# Patient Record
Sex: Male | Born: 1975 | Race: White | Hispanic: No | Marital: Married | Smoking: Former smoker
Health system: Southern US, Community
[De-identification: ages and names within clinical notes are randomized; demographics above are authoritative.]

## PROBLEM LIST (undated history)

## (undated) ENCOUNTER — Emergency Department (HOSPITAL_COMMUNITY): Admission: EM | Payer: BC Managed Care – PPO | Source: Home / Self Care

## (undated) DIAGNOSIS — G4730 Sleep apnea, unspecified: Secondary | ICD-10-CM

## (undated) DIAGNOSIS — L0591 Pilonidal cyst without abscess: Secondary | ICD-10-CM

## (undated) DIAGNOSIS — E079 Disorder of thyroid, unspecified: Secondary | ICD-10-CM

## (undated) DIAGNOSIS — N189 Chronic kidney disease, unspecified: Secondary | ICD-10-CM

## (undated) DIAGNOSIS — K219 Gastro-esophageal reflux disease without esophagitis: Secondary | ICD-10-CM

## (undated) DIAGNOSIS — F329 Major depressive disorder, single episode, unspecified: Secondary | ICD-10-CM

## (undated) DIAGNOSIS — F32A Depression, unspecified: Secondary | ICD-10-CM

## (undated) DIAGNOSIS — E119 Type 2 diabetes mellitus without complications: Secondary | ICD-10-CM

## (undated) DIAGNOSIS — M109 Gout, unspecified: Secondary | ICD-10-CM

## (undated) DIAGNOSIS — R7989 Other specified abnormal findings of blood chemistry: Secondary | ICD-10-CM

## (undated) DIAGNOSIS — F419 Anxiety disorder, unspecified: Secondary | ICD-10-CM

## (undated) HISTORY — DX: Other specified abnormal findings of blood chemistry: R79.89

## (undated) HISTORY — DX: Anxiety disorder, unspecified: F41.9

## (undated) HISTORY — DX: Depression, unspecified: F32.A

## (undated) HISTORY — PX: WISDOM TOOTH EXTRACTION: SHX21

## (undated) HISTORY — DX: Chronic kidney disease, unspecified: N18.9

## (undated) HISTORY — PX: CYST EXCISION: SHX5701

## (undated) HISTORY — DX: Pilonidal cyst without abscess: L05.91

## (undated) HISTORY — PX: PILONIDAL CYST EXCISION: SHX744

## (undated) HISTORY — DX: Type 2 diabetes mellitus without complications: E11.9

## (undated) HISTORY — DX: Major depressive disorder, single episode, unspecified: F32.9

---

## 2005-01-10 ENCOUNTER — Encounter: Admission: RE | Admit: 2005-01-10 | Discharge: 2005-01-10 | Payer: Self-pay | Admitting: Occupational Medicine

## 2005-05-17 ENCOUNTER — Emergency Department (HOSPITAL_COMMUNITY): Admission: EM | Admit: 2005-05-17 | Discharge: 2005-05-17 | Payer: Self-pay | Admitting: Family Medicine

## 2015-07-21 DIAGNOSIS — K760 Fatty (change of) liver, not elsewhere classified: Secondary | ICD-10-CM | POA: Insufficient documentation

## 2015-07-21 DIAGNOSIS — E291 Testicular hypofunction: Secondary | ICD-10-CM | POA: Insufficient documentation

## 2015-07-21 DIAGNOSIS — E039 Hypothyroidism, unspecified: Secondary | ICD-10-CM | POA: Insufficient documentation

## 2015-07-21 DIAGNOSIS — G4730 Sleep apnea, unspecified: Secondary | ICD-10-CM | POA: Insufficient documentation

## 2015-07-21 DIAGNOSIS — E669 Obesity, unspecified: Secondary | ICD-10-CM | POA: Insufficient documentation

## 2015-07-21 DIAGNOSIS — F341 Dysthymic disorder: Secondary | ICD-10-CM | POA: Insufficient documentation

## 2015-08-21 DIAGNOSIS — N201 Calculus of ureter: Secondary | ICD-10-CM

## 2015-08-21 HISTORY — DX: Calculus of ureter: N20.1

## 2015-11-09 ENCOUNTER — Emergency Department (HOSPITAL_BASED_OUTPATIENT_CLINIC_OR_DEPARTMENT_OTHER)
Admission: EM | Admit: 2015-11-09 | Discharge: 2015-11-09 | Disposition: A | Discharge status: Home / Self Care | Payer: Worker's Compensation | Attending: Emergency Medicine | Admitting: Emergency Medicine

## 2015-11-09 ENCOUNTER — Encounter (HOSPITAL_BASED_OUTPATIENT_CLINIC_OR_DEPARTMENT_OTHER): Payer: Self-pay | Admitting: *Deleted

## 2015-11-09 ENCOUNTER — Emergency Department (HOSPITAL_BASED_OUTPATIENT_CLINIC_OR_DEPARTMENT_OTHER): Payer: Worker's Compensation

## 2015-11-09 DIAGNOSIS — Z79899 Other long term (current) drug therapy: Secondary | ICD-10-CM | POA: Insufficient documentation

## 2015-11-09 DIAGNOSIS — M545 Low back pain: Secondary | ICD-10-CM | POA: Diagnosis present

## 2015-11-09 DIAGNOSIS — M5416 Radiculopathy, lumbar region: Secondary | ICD-10-CM | POA: Diagnosis not present

## 2015-11-09 HISTORY — DX: Disorder of thyroid, unspecified: E07.9

## 2015-11-09 MED ORDER — PREDNISONE 10 MG (21) PO TBPK: 10.0000 mg | ORAL_TABLET | Freq: Every day | ORAL | 0 refills | Status: DC

## 2015-11-09 MED ORDER — KETOROLAC TROMETHAMINE 30 MG/ML IJ SOLN
30.0000 mg | Freq: Once | INTRAMUSCULAR | Status: AC
  Administered 2015-11-09: 30 mg via INTRAMUSCULAR
  Filled 2015-11-09: qty 1

## 2015-11-09 MED ORDER — DEXAMETHASONE SODIUM PHOSPHATE 10 MG/ML IJ SOLN
10.0000 mg | Freq: Once | INTRAMUSCULAR | Status: AC
  Administered 2015-11-09: 10 mg via INTRAMUSCULAR
  Filled 2015-11-09: qty 1

## 2015-11-09 MED ORDER — KETOROLAC TROMETHAMINE 10 MG PO TABS: 10.0000 mg | ORAL_TABLET | Freq: Four times a day (QID) | ORAL | 0 refills | Status: DC | PRN

## 2015-11-09 NOTE — ED Notes (Signed)
C/o low back pain, (denies: radiation, impact, numbness/ tingling, spasms, urinary sx, loss of control of bowel or bladder, nvd, fever or other sx).  h/o distant back injury w/o surgery (relief with PT/rehab and disc hydration).

## 2015-11-09 NOTE — ED Notes (Signed)
EDP at [facility] 

## 2015-11-09 NOTE — ED Notes (Signed)
Pt to xray

## 2015-11-09 NOTE — ED Provider Notes (Signed)
[facility] Note   CSN: [id number] Arrival date & time: 11/09/15  2127  By signing my name below, I, Suzan SlickAshley N. Elon SpannerLeger, attest that this documentation has been prepared under the direction and in the presence of Jacalyn LefevreJulie Coy Rochford, MD.  Electronically Signed: Suzan SlickAshley N. Elon SpannerLeger, ED Scribe. 11/09/15. 9:45 PM.    History   Chief Complaint Chief Complaint  Patient presents with  . Back Pain   The history is provided by the patient. No language interpreter was used.    HPI Comments: Marc Fisher is a 40 y.o. male with a PMHx of a bulging disc to L5 and S1 in 2008 who presents to the Emergency Department complaining of constant, unchanged lower back pain that radiates down the LLE to the knee cap x 2 days. 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 Pt states he heard a "pop" in his back at time of incident. Discomfort to back is exacerbated with movement. No alleviating factors at this time. No OTC medications or home remedies attempted prior to arrival. No recent fever, chills, nausea, vomiting, numbness, or loss of sensation.  PCP: No primary care provider on file.    Past Medical History:  Diagnosis Date  . Thyroid disease     There are no active problems to display for this patient.   Past Surgical History:  Procedure Laterality Date  . PILONIDAL CYST EXCISION    . WISDOM TOOTH EXTRACTION         Home Medications    Prior to Admission medications   Medication Sig Start Date End Date Taking? Authorizing Provider  Levothyroxine Sodium (SYNTHROID PO) Take by mouth.   Yes Historical Provider, MD  ketorolac (TORADOL) 10 MG tablet Take 1 tablet (10 mg total) by mouth every 6 (six) hours as needed. 11/09/15   Jacalyn LefevreJulie Jacques Willingham, MD  predniSONE (STERAPRED UNI-PAK 21 TAB) 10 MG (21) TBPK tablet Take 1 tablet (10 mg total) by mouth daily. Take 6 tabs by mouth daily  for 2 days, then 5 tabs for 2 days, then 4  tabs for 2 days, then 3 tabs for 2 days, 2 tabs for 2 days, then 1 tab by mouth daily for 2 days 11/09/15   Jacalyn LefevreJulie Jefte Carithers, MD    Family History No family history on file.  Social History Social History  Substance Use Topics  . Smoking status: Never Smoker  . Smokeless tobacco: Never Used  . Alcohol use Yes     Allergies   Penicillins and Sulfa antibiotics   Review of Systems Review of Systems  Constitutional: Negative for chills and fever.  Musculoskeletal: Positive for back pain.  Neurological: Negative for weakness and numbness.  All other systems reviewed and are negative.    Physical Exam Updated Vital Signs BP 131/78 (BP Location: Left Arm)   Pulse 60   Temp 97.8 F (36.6 C) (Oral)   Resp 20   Ht 5' 8.75" (1.746 m)   Wt 250 lb (113.4 kg)   SpO2 100%   BMI 37.19 kg/m   Physical Exam  Constitutional: He is oriented to person, place, and time. He appears well-developed and well-nourished.  HENT:  Head: Normocephalic and atraumatic.  Eyes: EOM are normal.  Neck: Normal range of motion.  Cardiovascular: Normal rate, regular rhythm, normal heart sounds and intact distal pulses.   Pulmonary/Chest: Effort normal and breath sounds normal. No respiratory distress.  Abdominal: Soft. He  exhibits no distension. There is no tenderness.  Musculoskeletal: Normal range of motion. He exhibits tenderness.  Lower lumbar tenderness noted. Pain with movement of the L leg.  Neurological: He is alert and oriented to person, place, and time.  Skin: Skin is warm and dry.  Psychiatric: He has a normal mood and affect. Judgment normal.  Nursing note and vitals reviewed.    ED Treatments / Results   DIAGNOSTIC STUDIES: Oxygen Saturation is 100% on RA, Normal by my interpretation.    COORDINATION OF CARE: 9:44 PM- Will give Decadron and Toradol. Will order imaging. Discussed treatment plan with pt at bedside and pt agreed to plan.      Labs (all labs ordered are listed,  but only abnormal results are displayed) Labs Reviewed - No data to display  EKG  EKG Interpretation None       Radiology Dg Lumbar Spine Complete  Result Date: 11/09/2015 CLINICAL DATA:  Stepped off ladder and twisted lower back, low back pain, history of L5-S1 bulging disc EXAM: LUMBAR SPINE - COMPLETE 4+ VIEW COMPARISON:  CT abdomen/pelvis dated 08/20/2015 FINDINGS: Five lumbar type vertebral bodies. Normal lumbar lordosis. No evidence of fracture or dislocation. Vertebral body heights are maintained. Mild degenerative changes at L5-S1. Visualized bony pelvis appears intact. IMPRESSION: No fracture or dislocation is seen. Mild degenerative changes at L5-S1. Electronically Signed   By: Charline BillsSriyesh  Krishnan M.D.   On: 11/09/2015 22:18    Procedures Procedures (including critical care time)  Medications Ordered in ED Medications  dexamethasone (DECADRON) injection 10 mg (10 mg Intramuscular Given 11/09/15 2154)  ketorolac (TORADOL) 30 MG/ML injection 30 mg (30 mg Intramuscular Given 11/09/15 2154)     Initial Impression / Assessment and Plan / ED Course  I have reviewed the triage vital signs and the nursing notes.  Pertinent labs & imaging results that were available during my care of the patient were reviewed by me and considered in my medical decision making (see chart for details).  Clinical Course   Pt is feeling better.  He does not want narcotics here or at home.  He has an open house tomorrow at school and does not want to not be his best.  He is given info on back exercises and is given the number to Dr. Pearletha ForgeHudnall.  He knows to return if worse.  Final Clinical Impressions(s) / ED Diagnoses   Final diagnoses:  Lumbar radiculopathy    New Prescriptions New Prescriptions   KETOROLAC (TORADOL) 10 MG TABLET    Take 1 tablet (10 mg total) by mouth every 6 (six) hours as needed.   PREDNISONE (STERAPRED UNI-PAK 21 TAB) 10 MG (21) TBPK TABLET    Take 1 tablet (10 mg total) by  mouth daily. Take 6 tabs by mouth daily  for 2 days, then 5 tabs for 2 days, then 4 tabs for 2 days, then 3 tabs for 2 days, 2 tabs for 2 days, then 1 tab by mouth daily for 2 days   I personally performed the services described in this documentation, which was scribed in my presence. The recorded information has been reviewed and is accurate.    Jacalyn LefevreJulie Daly Whipkey, MD 11/09/15 2236

## 2015-11-09 NOTE — ED Triage Notes (Signed)
Lower back pain since yesterday after stepping off a ladder and feeling a pop in his back. No relief with Ibuprofen.

## 2015-11-22 ENCOUNTER — Ambulatory Visit (INDEPENDENT_AMBULATORY_CARE_PROVIDER_SITE_OTHER): Payer: Worker's Compensation | Admitting: Family Medicine

## 2015-11-22 ENCOUNTER — Encounter: Payer: Self-pay | Admitting: Family Medicine

## 2015-11-22 DIAGNOSIS — M545 Low back pain, unspecified: Secondary | ICD-10-CM

## 2015-11-22 DIAGNOSIS — M79605 Pain in left leg: Principal | ICD-10-CM

## 2015-11-22 NOTE — Patient Instructions (Signed)
You herniated a disc in your low back. You have completed the prednisone and your exam, history indicate the disc has shrunk down well and the nerve is not as irritated. At this point I would just take tylenol and/or aleve (2 tabs twice a day with food at most) as needed Stay as active as possible. Physical therapy has been shown to be helpful - we can consider this if you don't continue to improve. Strengthening of low back muscles, abdominal musculature are key for long term pain relief. Follow up with me in 4 weeks. I'd focus on the exercises for 2 weeks before trying a walk: jog program. Call me if you have any problems.

## 2015-11-27 DIAGNOSIS — M545 Low back pain, unspecified: Secondary | ICD-10-CM | POA: Insufficient documentation

## 2015-11-27 DIAGNOSIS — M79605 Pain in left leg: Principal | ICD-10-CM | POA: Insufficient documentation

## 2015-11-27 NOTE — Progress Notes (Signed)
PCP: Margaree MackintoshMCKINNEY, JOHN, MD  Subjective:   HPI: Patient is a 40 y.o. male here for low back pain.  Patient reports on 8/23 at work he was coming down a ladder, put his left foot to the floor, turned and felt/heard a sharp pop in low back. Immediate pain that was severe with burning into left thigh and midthigh. He took prednisone, had a toradol shot as well and has improved since then. Pain is down to 2/10, more dull. Still cannot lie down on belly and can't sleep for very long. Not taking any medications now. Remotely in 2008 he had problems at L5-S1 level but completely improved from this. No bowel/bladder dysfunction.  Past Medical History:  Diagnosis Date  . Thyroid disease     Current Outpatient Prescriptions on File Prior to Visit  Medication Sig Dispense Refill  . ketorolac (TORADOL) 10 MG tablet Take 1 tablet (10 mg total) by mouth every 6 (six) hours as needed. 20 tablet 0  . Levothyroxine Sodium (SYNTHROID PO) Take by mouth.    . predniSONE (STERAPRED UNI-PAK 21 TAB) 10 MG (21) TBPK tablet Take 1 tablet (10 mg total) by mouth daily. Take 6 tabs by mouth daily  for 2 days, then 5 tabs for 2 days, then 4 tabs for 2 days, then 3 tabs for 2 days, 2 tabs for 2 days, then 1 tab by mouth daily for 2 days 42 tablet 0   No current facility-administered medications on file prior to visit.     Past Surgical History:  Procedure Laterality Date  . PILONIDAL CYST EXCISION    . WISDOM TOOTH EXTRACTION      Allergies  Allergen Reactions  . Penicillins     hives  . Sulfa Antibiotics     Skin blisters and peels.    Social History   Social History  . Marital status: Single    Spouse name: N/A  . Number of children: N/A  . Years of education: N/A   Occupational History  . Not on file.   Social History Main Topics  . Smoking status: Never Smoker  . Smokeless tobacco: Never Used  . Alcohol use Yes  . Drug use: No  . Sexual activity: Not on file   Other Topics Concern  .  Not on file   Social History Narrative  . No narrative on file    No family history on file.  BP 111/73   Pulse 78   Ht 5\' 9"  (1.753 m)   Wt 254 lb (115.2 kg)   BMI 37.51 kg/m   Review of Systems: See HPI above.    Objective:  Physical Exam:  Gen: NAD, comfortable in exam room  Back: No gross deformity, scoliosis. No tenderness.  No midline or bony TTP. FROM with mild pain on flexion. Strength LEs 5/5 all muscle groups.   2+ MSRs in patellar and achilles tendons, equal bilaterally. Negative SLRs. Sensation intact to light touch bilaterally. Negative logroll bilateral hips Negative fabers and piriformis stretches.    Assessment & Plan:  1. Low back injury - consistent with disc herniation on left side.  Clinically improving following prednisone, toradol.  Switch to tylenol and/or aleve.  Shown home exercises to do daily.  F/u in 4 weeks.  He is on full duty.

## 2015-11-27 NOTE — Assessment & Plan Note (Signed)
consistent with disc herniation on left side.  Clinically improving following prednisone, toradol.  Switch to tylenol and/or aleve.  Shown home exercises to do daily.  F/u in 4 weeks.  He is on full duty.

## 2015-12-20 ENCOUNTER — Ambulatory Visit (INDEPENDENT_AMBULATORY_CARE_PROVIDER_SITE_OTHER): Payer: Worker's Compensation | Admitting: Family Medicine

## 2015-12-20 ENCOUNTER — Encounter: Payer: Self-pay | Admitting: Family Medicine

## 2015-12-20 VITALS — BP 118/78 | HR 81 | Ht 69.0 in

## 2015-12-20 DIAGNOSIS — M545 Low back pain, unspecified: Secondary | ICD-10-CM

## 2015-12-20 DIAGNOSIS — M79605 Pain in left leg: Principal | ICD-10-CM

## 2015-12-20 NOTE — Patient Instructions (Signed)
We will go ahead with an MRI of your lumbar spine to assess for a disc herniation. I will call you the business day following the MRI to go over results and next steps.

## 2015-12-21 NOTE — Assessment & Plan Note (Signed)
consistent with disc herniation on left side.  Now with decreased reflex and some weakness left side.  Not improving following prednisone, toradol, home exercise program.  Will go ahead with MRI to further assess given lack of improvement, decreased reflex, weakness, and episode of bladder incontinence.

## 2015-12-21 NOTE — Progress Notes (Addendum)
PCP: Margaree Mackintosh, MD  Subjective:   HPI: Patient is a 40 y.o. male here for low back pain.  9/6: Patient reports on 8/23 at work he was coming down a ladder, put his left foot to the floor, turned and felt/heard a sharp pop in low back. Immediate pain that was severe with burning into left thigh and midthigh. He took prednisone, had a toradol shot as well and has improved since then. Pain is down to 2/10, more dull. Still cannot lie down on belly and can't sleep for very long. Not taking any medications now. Remotely in 2008 he had problems at L5-S1 level but completely improved from this. No bowel/bladder dysfunction.  10/4: Patient reports he feels the same if not a little worse compared to last visit. Pain level is 3/10, up to 6/10 at times. At work full duty. Back pain worse and catches with sitting to standing. Not taking any medicines but doing home exercises. Pain described as a tightness. Gets tingling, pain into left leg to mid-thigh. One episode of possible bladder dysfunction while sleeping.    Past Medical History:  Diagnosis Date  . Thyroid disease     Current Outpatient Prescriptions on File Prior to Visit  Medication Sig Dispense Refill  . tamsulosin (FLOMAX) 0.4 MG CAPS capsule TK 1 C PO QD  0  . testosterone (ANDROGEL) 50 MG/5GM (1%) GEL APP 2 PACKETS TO SKIN D  0   No current facility-administered medications on file prior to visit.     Past Surgical History:  Procedure Laterality Date  . PILONIDAL CYST EXCISION    . WISDOM TOOTH EXTRACTION      Allergies  Allergen Reactions  . Penicillins     hives  . Sulfa Antibiotics     Skin blisters and peels.    Social History   Social History  . Marital status: Single    Spouse name: N/A  . Number of children: N/A  . Years of education: N/A   Occupational History  . Not on file.   Social History Main Topics  . Smoking status: Never Smoker  . Smokeless tobacco: Never Used  . Alcohol use  Yes  . Drug use: No  . Sexual activity: Not on file   Other Topics Concern  . Not on file   Social History Narrative  . No narrative on file    No family history on file.  BP 118/78   Pulse 81   Ht 5\' 9"  (1.753 m)   Review of Systems: See HPI above.    Objective:  Physical Exam:  Gen: NAD, comfortable in exam room  Back: No gross deformity, scoliosis. No tenderness.  No midline or bony TTP. FROM with mild pain on flexion. Strength LEs 5/5 all muscle groups except 4/5 left plantarflexion.   2+ MSRs in patellar and right achilles tendons, trace left achilles tendon. Negative SLRs. Sensation intact to light touch bilaterally. Negative logroll bilateral hips Negative fabers and piriformis stretches.    Assessment & Plan:  1. Low back injury - consistent with disc herniation on left side.  Now with decreased reflex and some weakness left side.  Not improving following prednisone, toradol, home exercise program.  Will go ahead with MRI to further assess given lack of improvement, decreased reflex, weakness, and episode of bladder incontinence.    Addendum:  MRI reviewed and discussed with patient.  He has a disc protrusion at L5-S1 contacting the left S1 nerve root - this was caused  by his work injury.  Discussed options - will go ahead with ESI at this level on the left.  Advised him to give us a call in 1-2 weeks for an update on his status.  Plan to follow up in 4-6 weeks.

## 2015-12-29 ENCOUNTER — Ambulatory Visit (INDEPENDENT_AMBULATORY_CARE_PROVIDER_SITE_OTHER): Payer: BC Managed Care – PPO | Admitting: Urgent Care

## 2015-12-29 VITALS — BP 128/84 | HR 72 | Temp 98.2°F | Resp 17 | Ht 68.0 in | Wt 264.0 lb

## 2015-12-29 DIAGNOSIS — K122 Cellulitis and abscess of mouth: Secondary | ICD-10-CM | POA: Diagnosis not present

## 2015-12-29 DIAGNOSIS — J029 Acute pharyngitis, unspecified: Secondary | ICD-10-CM | POA: Diagnosis not present

## 2015-12-29 LAB — POCT RAPID STREP A (OFFICE): Rapid Strep A Screen: NEGATIVE

## 2015-12-29 MED ORDER — AZITHROMYCIN 250 MG PO TABS: ORAL_TABLET | ORAL | 0 refills | Status: DC

## 2015-12-29 NOTE — Progress Notes (Addendum)
    MRN: [id number] DOB: Mar 26, 1975  Subjective:   Marc Fisher is a 40 y.o. male presenting for chief complaint of swelling in back of throat (onset this am)  Reports sudden onset of sore throat, swelling of his uvula, redness this morning. Had some difficulty with brushing his teeth and swallowing this morning. Has not tried any medications for relief. Denies fever, sinus pain, sinus congestion, ear pain, ear drainage, cough, lymph node pain. 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 Denies smoking cigarettes.  Marc DistanceRodney has a current medication list which includes the following prescription(s): levothyroxine and testosterone. Also is allergic to penicillins and sulfa antibiotics.  Marc DistanceRodney  has a past medical history of Pilonidal cyst and Thyroid disease. Also  has a past surgical history that includes Pilonidal cyst excision and Wisdom tooth extraction.  Objective:   Vitals: BP 128/84 (BP Location: Right Arm, Patient Position: Sitting, Cuff Size: Large)   Pulse 72   Temp 98.2 F (36.8 C) (Oral)   Resp 17   Ht 5\' 8"  (1.727 m)   Wt 264 lb (119.7 kg)   SpO2 96%   BMI 40.14 kg/m   Physical Exam  Constitutional: He is oriented to person, place, and time. He appears well-developed and well-nourished.  HENT:  TM's intact bilaterally, no effusions or erythema. Nasal turbinates pink and moist, nasal passages patent. No sinus tenderness. Uvula is erythematous and edematous, no tonsillar exudates, edema or erythema. Mucous membranes moist, dentition in good repair.  Eyes: No scleral icterus.  Neck: Normal range of motion. Neck supple.  Cardiovascular: Normal rate.   Pulmonary/Chest: Effort normal.  Lymphadenopathy:    He has cervical adenopathy (left-sided).  Neurological: He is alert and oriented to person, place, and time.  Skin: Skin is warm and dry.   Results for orders placed or performed in visit on 12/29/15 (from the past 24 hour(s))  POCT rapid strep A      Status: None   Collection Time: 12/29/15 12:46 PM  Result Value Ref Range   Rapid Strep A Screen Negative Negative   Assessment and Plan :   1. Uvulitis 2. Sore throat - Will treat empirically per UpToDate guidelines with Azithromycin given PCN allergies. Patient is to rtc in 3 days if no improvement.  Wallis BambergMario Lory Nowaczyk, PA-C [facility] [phone] 12/29/2015 12:23 PM

## 2015-12-29 NOTE — Patient Instructions (Addendum)
Uvulitis Uvulitis is infection or inflammation of the uvula. The uvula is the small, finger-like piece of tissue that hangs down at the back of your throat. CAUSES This condition may be caused by:  An infection in the mouth or throat. This is the most common cause.  Trauma to the uvula. Causes of trauma include burning your mouth and heavy snoring.  Fluid build-up (edema). Edema can be triggered be an allergic reaction. Uvulitis that is caused by edema is called Quincke disease.  Inhaling irritants, such as chemical agents, smoke, or steam. SYMPTOMS Symptoms of this condition depend on the cause.  Symptoms of uvulitis that is caused by infection include:  Red, swollen uvula.  Sore throat.  Fever.  Headache.  Swollen neck glands. Symptoms of uvulitis that is caused by trauma, edema, or irritation include:  Red, swollen uvula.  Sore throat.  Trouble swallowing.  Choking or gagging.  Trouble breathing. DIAGNOSIS This condition is diagnosed with a physical exam. You also may have tests, such as a throat culture and blood tests. TREATMENT Treatment for this condition depends on the cause. Treatment may involve:  Antibiotic medicine. Antibiotics may be prescribed if a bacterial infection is the cause.  Steroid medicine. Steroids may be given if edema is the cause.  Surgery to remove part of the uvula (partial uvulectomy). HOME CARE INSTRUCTIONS  Rest as much as possible until your condition improves.  Drink enough fluid to keep your urine clear or pale yellow.  Take over-the-counter and prescription medicines only as told by your health care provider.  If you were prescribed an antibiotic medicine, take it as told by your health care provider. Do not stop taking the antibiotic even if you start to feel better.  Use a cool-mist humidifier to ease irritation in your throat.  While your throat is sore:  Eat soft foods or drink liquids, such as soup.  Gargle with a  salt-water mixture 3-4 times per day or as needed. To make a salt-water mixture, completely dissolve -1 tsp of salt in 1 cup of warm water.  Keep all follow-up visits as told by your health care provider. This is important. SEEK MEDICAL CARE IF:  You have a fever.  You have trouble eating.  Your symptoms do not get better.  Your symptoms come back after treatment. SEEK IMMEDIATE MEDICAL CARE IF:  You have trouble breathing.  You have trouble swallowing.   This information is not intended to replace advice given to you by your health care provider. Make sure you discuss any questions you have with your health care provider.   Document Released: 10/13/2003 Document Revised: 11/23/2014 Document Reviewed: 05/25/2014 Elsevier Interactive Patient Education 2016 [employer]     IF you received an x-ray today, you will receive an invoice from [facility]. Please contact [facility] at [phone] with questions or concerns regarding your invoice.   IF you received labwork today, you will receive an invoice from [facility]. Please contact [facility] at [phone] with questions or concerns regarding your invoice.   Our billing staff will not be able to assist you with questions regarding bills from these companies.  You will be contacted with the lab results as soon as they are available. The fastest way to get your results is to activate your My Chart account. Instructions are located on the last page of this paperwork. If you have not heard from us regarding the results in 2 weeks, please contact this office.     

## 2015-12-31 LAB — CULTURE, GROUP A STREP

## 2016-01-05 NOTE — Addendum Note (Signed)
Addended by: Kathi SimpersWISE, Victorino Fatzinger F on: 01/05/2016 11:14 AM   Modules accepted: Orders

## 2016-01-12 ENCOUNTER — Encounter: Payer: Self-pay | Admitting: Family Medicine

## 2016-01-30 ENCOUNTER — Telehealth: Payer: Self-pay | Admitting: Family Medicine

## 2016-02-05 ENCOUNTER — Encounter (INDEPENDENT_AMBULATORY_CARE_PROVIDER_SITE_OTHER): Payer: Self-pay

## 2016-02-05 ENCOUNTER — Ambulatory Visit (INDEPENDENT_AMBULATORY_CARE_PROVIDER_SITE_OTHER): Payer: Worker's Compensation | Admitting: Family Medicine

## 2016-02-05 DIAGNOSIS — M545 Low back pain, unspecified: Secondary | ICD-10-CM

## 2016-02-05 DIAGNOSIS — M79605 Pain in left leg: Principal | ICD-10-CM

## 2016-02-05 NOTE — Assessment & Plan Note (Signed)
pain 2/2 protrusion at L5-S1 contacting S2 nerve root on left side, due to injury at work.  Has not yet had ESI at this level unfortunately - will check on reason for this.  s/p prednisone, toradol, home exercise program.  [facility].

## 2016-02-05 NOTE — Progress Notes (Signed)
PCP: Marc Fisher, JOHN, MD  Subjective:   HPI: Patient is a 40 y.o. male here for low back pain.  9/6: Patient reports on 8/23 at work he was coming down a ladder, put his left foot to the floor, turned and felt/heard a sharp pop in low back. Immediate pain that was severe with burning into left thigh and midthigh. He took prednisone, had a toradol shot as well and has improved since then. Pain is down to 2/10, more dull. Still cannot lie down on belly and can't sleep for very long. Not taking any medications now. Remotely in 2008 he had problems at L5-S1 level but completely improved from this. No bowel/bladder dysfunction.  10/4: Patient reports he feels the same if not a little worse compared to last visit. Pain level is 3/10, up to 6/10 at times. At work full duty. Back pain worse and catches with sitting to standing. Not taking any medicines but doing home exercises. Pain described as a tightness. Gets tingling, pain into left leg to mid-thigh. One episode of possible bladder dysfunction while sleeping.    11/20: Patient returns with continued low back pain. Pain 5/10, sharp. Worse with extending, after prolonged sitting and going to stand up. Taking ibuprofen 800 bid now. Has not yet had epidural steroid injection low back. Reports pain into both feet that is new with pins and needles sensation. Primarily with symptoms in left leg. No other episodes of bowel/bladder dysfunction. No skin changes.  Past Medical History:  Diagnosis Date  . Pilonidal cyst   . Thyroid disease     Current Outpatient Prescriptions on File Prior to Visit  Medication Sig Dispense Refill  . testosterone (ANDROGEL) 50 MG/5GM (1%) GEL APP 2 PACKETS TO SKIN D  0   No current facility-administered medications on file prior to visit.     Past Surgical History:  Procedure Laterality Date  . PILONIDAL CYST EXCISION    . WISDOM TOOTH EXTRACTION      Allergies  Allergen Reactions  .  Penicillins     hives  . Sulfa Antibiotics     Skin blisters and peels.    Social History   Social History  . Marital status: Married    Spouse name: N/A  . Number of children: N/A  . Years of education: N/A   Occupational History  . Not on file.   Social History Main Topics  . Smoking status: Never Smoker  . Smokeless tobacco: Never Used  . Alcohol use Yes  . Drug use: No  . Sexual activity: Not on file   Other Topics Concern  . Not on file   Social History Narrative  . No narrative on file    Family History  Problem Relation Age of Onset  . Diabetes Mother   . Diabetes Maternal Grandfather   . Hyperlipidemia Paternal Grandfather     There were no vitals taken for this visit.  Review of Systems: See HPI above.    Objective:  Physical Exam:  Gen: NAD, comfortable in exam room  Back: No gross deformity, scoliosis. No tenderness.  No midline or bony TTP. FROM with mild pain on flexion. Strength LEs 5/5 all muscle groups except 4/5 left plantarflexion and 5-/5 with knee extension.   Trace MSRs in patellar and achilles tendons. Negative SLRs. Sensation intact to light touch bilaterally. Negative logroll bilateral hips Negative fabers and piriformis stretches.    Assessment & Plan:  1. Low back injury - pain 2/2 protrusion at L5-S1  contacting S2 nerve root on left side, due to injury at work.  Has not yet had ESI at this level unfortunately - will check on reason for this.  s/p prednisone, toradol, home exercise program.  [facility].

## 2016-02-06 NOTE — Telephone Encounter (Signed)
Resent order for ESI to [facility]. Waiting to hear from them.

## 2017-10-02 ENCOUNTER — Encounter: Payer: Self-pay | Admitting: Family Medicine

## 2017-10-02 ENCOUNTER — Other Ambulatory Visit: Payer: Self-pay

## 2017-10-02 ENCOUNTER — Encounter

## 2017-10-02 ENCOUNTER — Ambulatory Visit: Payer: BC Managed Care – PPO | Admitting: Family Medicine

## 2017-10-02 VITALS — BP 122/72 | HR 65 | Temp 98.2°F | Resp 16 | Ht 68.0 in | Wt 258.0 lb

## 2017-10-02 DIAGNOSIS — Z6839 Body mass index (BMI) 39.0-39.9, adult: Secondary | ICD-10-CM

## 2017-10-02 DIAGNOSIS — Z8639 Personal history of other endocrine, nutritional and metabolic disease: Secondary | ICD-10-CM

## 2017-10-02 DIAGNOSIS — R232 Flushing: Secondary | ICD-10-CM | POA: Diagnosis not present

## 2017-10-02 DIAGNOSIS — M2550 Pain in unspecified joint: Secondary | ICD-10-CM

## 2017-10-02 DIAGNOSIS — E039 Hypothyroidism, unspecified: Secondary | ICD-10-CM | POA: Diagnosis not present

## 2017-10-02 DIAGNOSIS — R7303 Prediabetes: Secondary | ICD-10-CM

## 2017-10-02 DIAGNOSIS — K760 Fatty (change of) liver, not elsewhere classified: Secondary | ICD-10-CM

## 2017-10-02 DIAGNOSIS — R203 Hyperesthesia: Secondary | ICD-10-CM

## 2017-10-02 DIAGNOSIS — M791 Myalgia, unspecified site: Secondary | ICD-10-CM

## 2017-10-02 MED ORDER — LEVOTHYROXINE SODIUM 100 MCG PO TABS: 100.0000 ug | ORAL_TABLET | Freq: Every day | ORAL | 0 refills | Status: DC

## 2017-10-02 NOTE — Patient Instructions (Addendum)
Start a high dose glucosamine-chondroiton >1800mg  of each x 1 mo.  If ineffective, then switch to tumeric OR circumin - both need black pepper in it to activate it.    Recheck in 2 months -for that visit, please make an appointment first thing in the morning at 8 am.   To ensure that your lab results at the next visit are as accurate as possible, it is important that you come in for your next visit FASTING as close to 8 am as possible.   Minimizing activity and time as much as possible and reasonable between rolling out of bed and having your blood drawn is thought to progressively increase the accuracy of results. (Ok, I'll let you brush your teeth AND you can change into clean underwear as long as you try to be lazy about it -e.g. don't work-out that morning or run a.m. errands.)  Do not eat or drink anything after the midnight prior to your blood draw other than water, black coffee, or unsweet tea. If you normally take morning medications, vitamins, and/or supplements, it would be ideal to pack a to-go baggie of your a.m. doses the evening prior, which you could then take after the blood draw when you are also allowed your breakfast and coffee with your creamer.  However, if this is to disruptive to your finely tuned regimen, I imagine that taking a few medications or supplements with sips of water only within 30 minutes of your impending blood draw would not allow enough time or quantity to change your blood levels to a clinically significant degree.   Collect the cotton swabs with saliva by placing the corner of your cheek around 6 am if possible - or right when you wake up - keep them at the side of your bed so you can just do it first thing.  Make SURE you record the time when you collect it.  Then drop them off at the office.   IF you received an x-ray today, you will receive an invoice from [facility]. Please contact [facility] at [phone] with questions or concerns  regarding your invoice.   IF you received labwork today, you will receive an invoice from [facility]. Please contact [facility] at [phone] with questions or concerns regarding your invoice.   Our billing staff will not be able to assist you with questions regarding bills from these companies.  You will be contacted with the lab results as soon as they are available. The fastest way to get your results is to activate your My Chart account. Instructions are located on the last page of this paperwork. If you have not heard from us regarding the results in 2 weeks, please contact this office.     Joint Pain Joint pain, which is also called arthralgia, can be caused by many things. Joint pain often goes away when you follow your health care provider's instructions for relieving pain at home. However, joint pain can also be caused by conditions that require further treatment. Common causes of joint pain include:  Bruising in the area of the joint.  Overuse of the joint.  Wear and tear on the joints that occur with aging (osteoarthritis).  Various other forms of arthritis.  A buildup of a crystal form of uric acid in the joint (gout).  Infections of the joint (septic arthritis) or of the bone (osteomyelitis).  Your health care provider may recommend medicine to help with the pain. If your joint pain continues, additional tests may  be needed to diagnose your condition. Follow these instructions at home: Watch your condition for any changes. Follow these instructions as directed to lessen the pain that you are feeling.  Take medicines only as directed by your health care provider.  Rest the affected area for as long as your health care provider says that you should. If directed to do so, raise the painful joint above the level of your heart while you are sitting or lying down.  Do not do things that cause or worsen pain.  If directed, apply ice to the painful area: ? Put ice in a plastic  bag. ? Place a towel between your skin and the bag. ? Leave the ice on for 20 minutes, 2-3 times per day.  Wear an elastic bandage, splint, or sling as directed by your health care provider. Loosen the elastic bandage or splint if your fingers or toes become numb and tingle, or if they turn cold and blue.  Begin exercising or stretching the affected area as directed by your health care provider. Ask your health care provider what types of exercise are safe for you.  Keep all follow-up visits as directed by your health care provider. This is important.  Contact a health care provider if:  Your pain increases, and medicine does not help.  Your joint pain does not improve within 3 days.  You have increased bruising or swelling.  You have a fever.  You lose 10 lb (4.5 kg) or more without trying. Get help right away if:  You are not able to move the joint.  Your fingers or toes become numb or they turn cold and blue. This information is not intended to replace advice given to you by your health care provider. Make sure you discuss any questions you have with your health care provider. Document Released: 03/04/2005 Document Revised: 08/04/2015 Document Reviewed: 12/14/2013 Elsevier Interactive Patient Education  [employer].

## 2017-10-02 NOTE — Progress Notes (Addendum)
Subjective:    Patient ID: Marc Fisher, male    DOB: Jul 16, 1975, 42 y.o.   MRN: [id number] Chief Complaint  Patient presents with  . Establish Care    pt would like to discuss hot flashes  . Joint Pain    pt has had for a while and wants it addressed    HPI Has been having night sweats for years but feeling more hot flashes - at night and sweating through clothes and sheets. Whole 32 and lost 40 lbs and was doing cross fit - and then hyurt knee 6/28 - almost back to 100% - [facility].   [facility] [place] [facility] Dr[facility] Caprice Beaver  Had hormone level checked.  Did twice monthly and daily topical and couldn't get levels up to the low side of normal Did see   Developed hypothyroid 7 yrs ago - mother had hers removed in high school and sister and niece are both on thyroid medication.  Has been having pain in the mornings while he has to hold onto the wall - knuckles, feet, ankle, knees - 10 years.  PGF with arthritis in his hands and GM had arthritis. H/o gout in left knee about >6 yrs years - became chronic and took allopurinol   Very limited mobilty - particularly in back and hips and tendont  Gets lots of bone spurs into joints  Refuses reflex test.   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  Hair thinning some - starting a shampoo.   Past Medical History:  Diagnosis Date  . Depression   . Pilonidal cyst   . Thyroid disease    Past Surgical History:  Procedure Laterality Date  . PILONIDAL CYST EXCISION    . WISDOM TOOTH EXTRACTION     No current outpatient medications on file prior to visit.   No current facility-administered medications on file prior to visit.    Allergies  Allergen Reactions  . Penicillins     hives  . Sulfa Antibiotics     Skin blisters and peels.   Family History  Problem Relation Age of Onset  . Diabetes Mother   . Diabetes Maternal Grandfather   . Hyperlipidemia Paternal Grandfather    Social  History   Socioeconomic History  . Marital status: Married    Spouse name: Not on file  . Number of children: Not on file  . Years of education: Not on file  . Highest education level: Not on file  Occupational History  . Not on file  Social Needs  . Financial resource strain: Not on file  . Food insecurity:    Worry: Not on file    Inability: Not on file  . Transportation needs:    Medical: Not on file    Non-medical: Not on file  Tobacco Use  . Smoking status: Never Smoker  . Smokeless tobacco: Never Used  Substance and Sexual Activity  . Alcohol use: Yes  . Drug use: No  . Sexual activity: Not on file  Lifestyle  . Physical activity:    Days per week: Not on file    Minutes per session: Not on file  . Stress: Not on file  Relationships  . Social connections:    Talks on phone: Not on file    Gets together: Not on file    Attends religious service: Not on file    Active member of club or organization: Not on file  Attends meetings of clubs or organizations: Not on file    Relationship status: Not on file  Other Topics Concern  . Not on file  Social History Narrative  . Not on file   Depression screen [facility] 2/9 10/02/2017 12/29/2015  Decreased Interest 0 0  Down, Depressed, Hopeless 0 0  PHQ - 2 Score 0 0     Review of Systems See hpi    Objective:   Physical Exam  Constitutional: He is oriented to person, place, and time. He appears well-developed and well-nourished. No distress.  HENT:  Head: Normocephalic and atraumatic.  Eyes: Pupils are equal, round, and reactive to light. Conjunctivae are normal. No scleral icterus.  Neck: Normal range of motion. Neck supple. No thyromegaly present.  Cardiovascular: Normal rate, regular rhythm, normal heart sounds and intact distal pulses.  Pulmonary/Chest: Effort normal and breath sounds normal. No respiratory distress.  Musculoskeletal: He exhibits no edema.  Lymphadenopathy:    He has no cervical adenopathy.    Neurological: He is alert and oriented to person, place, and time.  Refuses strength or reflex testing due to severity of pain  Skin: Skin is warm and dry. He is not diaphoretic.  Psychiatric: He has a normal mood and affect. His behavior is normal.      BP 122/72   Pulse 65   Temp 98.2 F (36.8 C) (Oral)   Resp 16   Ht _0  (1.727 m)   Wt 258 lb (117 kg)   SpO2 96%   BMI 39.23 kg/m   Assessment & Plan:   1. Arthralgia, unspecified joint   2. Prediabetes   3. Hot flash in male   4. Acquired hypothyroidism   5. Personal history of other endocrine, nutritional and metabolic disease   6. Hyperesthesia   7. Class 2 severe obesity due to excess calories with serious comorbidity and body mass index (BMI) of 39.0 to 39.9 in adult ([place])   8. Fatty liver   9. Myalgia    See AVS for pt instructions  Orders Placed This Encounter  Procedures  . Vitamin B12  . VITAMIN D 25 Hydroxy (Vit-D Deficiency, Fractures)  . TSH+T4F+T3Free  . Hemoglobin A1c  . Sedimentation Rate  . Uric A+ANA+RA Qn+CRP+ASO  . Salivary Cortisol X2, Timed  . Estradiol  . Thyroid antibodies  . Comprehensive metabolic panel  . HLA-B27 Antigen    Meds ordered this encounter  Medications  . levothyroxine (SYNTHROID, LEVOTHROID) 100 MCG tablet    Sig: Take 1 tablet (100 mcg total) by mouth daily.    Dispense:  90 tablet    Refill:  0    Delman Cheadle, MD, MPH Primary Care at [facility] [place], [place]  [phone] Office phone  [phone] Office fax   12/25/17 9:39 AM

## 2017-10-06 ENCOUNTER — Telehealth: Payer: Self-pay | Admitting: Family Medicine

## 2017-10-06 NOTE — Telephone Encounter (Signed)
Copied from CRM [id number]. Topic: Quick Communication - See Telephone Encounter >> Oct 06, 2017  5:35 PM Lorrine KinMcGee, Neeta Storey B, NT wrote: CRM for notification. See Telephone encounter for: 10/06/17. Patient calling and would like a call with his lab results from 10/02/17. Please advise.

## 2017-10-07 LAB — COMPREHENSIVE METABOLIC PANEL
ALT: 32 IU/L (ref 0–44)
AST: 20 IU/L (ref 0–40)
Albumin/Globulin Ratio: 2 (ref 1.2–2.2)
Albumin: 4.8 g/dL (ref 3.5–5.5)
Alkaline Phosphatase: 75 IU/L (ref 39–117)
BUN/Creatinine Ratio: 16 (ref 9–20)
BUN: 22 mg/dL — ABNORMAL HIGH (ref 6–20)
Bilirubin Total: 0.4 mg/dL (ref 0.0–1.2)
CO2: 22 mmol/L (ref 20–29)
Calcium: 9.7 mg/dL (ref 8.7–10.2)
Chloride: 105 mmol/L (ref 96–106)
Creatinine, Ser: 1.38 mg/dL — ABNORMAL HIGH (ref 0.76–1.27)
GFR calc Af Amer: 74 mL/min/{1.73_m2} (ref >59)
GFR calc non Af Amer: 64 mL/min/{1.73_m2} (ref >59)
Globulin, Total: 2.4 g/dL (ref 1.5–4.5)
Glucose: 95 mg/dL (ref 65–99)
Potassium: 4.8 mmol/L (ref 3.5–5.2)
Sodium: 145 mmol/L — ABNORMAL HIGH (ref 134–144)
Total Protein: 7.2 g/dL (ref 6.0–8.5)

## 2017-10-07 LAB — HEMOGLOBIN A1C
Est. average glucose Bld gHb Est-mCnc: 123 mg/dL
Hgb A1c MFr Bld: 5.9 % — ABNORMAL HIGH (ref 4.8–5.6)

## 2017-10-07 LAB — TSH+T4F+T3FREE
Free T4: 1.13 ng/dL (ref 0.82–1.77)
T3, Free: 3.5 pg/mL (ref 2.0–4.4)
TSH: 5.88 u[IU]/mL — ABNORMAL HIGH (ref 0.450–4.500)

## 2017-10-07 LAB — THYROID ANTIBODIES
Thyroglobulin Antibody: <1 IU/mL (ref 0.0–0.9)
Thyroperoxidase Ab SerPl-aCnc: 23 IU/mL (ref 0–34)

## 2017-10-07 LAB — URIC A+ANA+RA QN+CRP+ASO
ASO: 133 IU/mL (ref 0.0–200.0)
Anti Nuclear Antibody(ANA): NEGATIVE
CRP: 1 mg/L (ref 0–10)
Rheumatoid fact SerPl-aCnc: <10 IU/mL (ref 0.0–13.9)
Uric Acid: 8.3 mg/dL (ref 3.7–8.6)

## 2017-10-07 LAB — SEDIMENTATION RATE: Sed Rate: 2 mm/hr (ref 0–15)

## 2017-10-07 LAB — ESTRADIOL: Estradiol: 20.4 pg/mL (ref 7.6–42.6)

## 2017-10-07 LAB — HLA-B27 ANTIGEN: HLA B27: NEGATIVE

## 2017-10-07 LAB — VITAMIN B12: Vitamin B-12: 954 pg/mL (ref 232–1245)

## 2017-10-07 LAB — VITAMIN D 25 HYDROXY (VIT D DEFICIENCY, FRACTURES): Vit D, 25-Hydroxy: 43.3 ng/mL (ref 30.0–100.0)

## 2017-10-08 NOTE — Telephone Encounter (Signed)
Please advise, I see they have been resulted but haven't been signed off on by you.

## 2017-10-11 LAB — SALIVARY CORTISOL X2, TIMED
Salivary Cortisol 2nd Specimen: 0.243 ug/dL
Salivary Cortisol Baseline: 0.21 ug/dL

## 2017-11-07 NOTE — Telephone Encounter (Signed)
Pt calling to get lab results.

## 2017-11-11 ENCOUNTER — Other Ambulatory Visit: Payer: Self-pay | Admitting: Family Medicine

## 2017-11-11 NOTE — Progress Notes (Signed)
Adding Z68.39 BODY MASS INDEX BMI 39.0-39.9 ADULT to lab order for vitamin d  Adding Z86.39 PERS HX OTH ENDOCRN NUTRIT&METAB DZ to lab order for vitamin b12

## 2017-12-24 ENCOUNTER — Ambulatory Visit: Payer: Self-pay | Admitting: *Deleted

## 2017-12-24 ENCOUNTER — Telehealth: Payer: Self-pay | Admitting: Family Medicine

## 2017-12-24 NOTE — Telephone Encounter (Signed)
Patient called wanting to discuss his lab results with Dr. Clelia Croft  from 10/03/17 that have not been resulted. Routed to PCP for resolution. Phone call to PCP regarding TE.

## 2017-12-24 NOTE — Telephone Encounter (Signed)
See TE today for information. No triage performed.

## 2017-12-25 ENCOUNTER — Encounter: Payer: Self-pay | Admitting: Family Medicine

## 2017-12-25 DIAGNOSIS — M2550 Pain in unspecified joint: Secondary | ICD-10-CM | POA: Insufficient documentation

## 2017-12-25 DIAGNOSIS — R7303 Prediabetes: Secondary | ICD-10-CM | POA: Insufficient documentation

## 2017-12-25 HISTORY — DX: Pain in unspecified joint: M25.50

## 2017-12-25 MED ORDER — ALLOPURINOL 100 MG PO TABS: 100.0000 mg | ORAL_TABLET | Freq: Every day | ORAL | 0 refills | Status: DC

## 2017-12-25 MED ORDER — LEVOTHYROXINE SODIUM 112 MCG PO TABS: 112.0000 ug | ORAL_TABLET | Freq: Every day | ORAL | 0 refills | Status: DC

## 2017-12-25 NOTE — Telephone Encounter (Signed)
They are available on mychart.

## 2017-12-25 NOTE — Telephone Encounter (Signed)
Patient was advised he is going to check my chart for all his results.  Will reach out if he has any other questions.

## 2017-12-25 NOTE — Telephone Encounter (Signed)
Patient has called several times so please call him to let him know to check his MyChart for my comments on his results. I have added my interpretation onto each lab individually and sent them all to my chart.  Patient can review there and come in for office visit to discuss if any further detailed questions as far as next steps and work-up and treatment.  If there are detailed questions about meanings of labs etc. please send me a phone message or patient can send me a MyChart message.

## 2017-12-25 NOTE — Addendum Note (Signed)
Addended by: Sherren Mocha on: 12/25/2017 11:14 AM   Modules accepted: Orders

## 2018-03-22 ENCOUNTER — Other Ambulatory Visit: Payer: Self-pay | Admitting: Family Medicine

## 2018-03-23 NOTE — Telephone Encounter (Signed)
Requested Prescriptions  Pending Prescriptions Disp Refills  . allopurinol (ZYLOPRIM) 100 MG tablet [Pharmacy Med Name: ALLOPURINOL 100MG  TABLETS] 90 tablet 0    Sig: TAKE 1 TABLET(100 MG) BY MOUTH DAILY     Endocrinology:  Gout Agents Failed - 03/22/2018  4:11 AM      Failed - Cr in normal range and within 360 days    Creatinine, Ser  Date Value Ref Range Status  10/02/2017 1.38 (H) 0.76 - 1.27 mg/dL Final         Passed - Uric Acid in normal range and within 360 days    Uric Acid  Date Value Ref Range Status  10/02/2017 8.3 3.7 - 8.6 mg/dL Final    Comment:               Therapeutic target for gout patients: <6.0         Passed - Valid encounter within last 12 months    Recent Outpatient Visits          5 months ago Arthralgia, unspecified joint   [facility], Lev[place] July, MD   2 years ago Uvulitis   Primary Care at [facility], [place], [place]      Future Appointments            In 1 week Sherren Mocha, MD [facility] at [place], [facility]         . levothyroxine (SYNTHROID, LEVOTHROID) 112 MCG tablet [Pharmacy Med Name: LEVOTHYROXINE 0.112MG  ( ) TABS] 90 tablet 0    Sig: TAKE 1 TABLET BY MOUTH DAILY BEFORE BREAKFAST     Endocrinology:  Hypothyroid Agents Failed - 03/22/2018  4:11 AM      Failed - TSH needs to be rechecked within 3 months after an abnormal result. Refill until TSH is due.      Failed - TSH in normal range and within 360 days    TSH  Date Value Ref Range Status  10/02/2017 5.880 (H) 0.450 - 4.500 uIU/mL Final         Passed - Valid encounter within last 12 months    Recent Outpatient Visits          5 months ago Arthralgia, unspecified joint   [facility], Levell July, MD   2 years ago Uvulitis   Primary Care at [facility], [place], [place]      Future Appointments            In 1 week Clelia Croft Levell July, MD Primary Care at [place], [facility]

## 2018-03-30 ENCOUNTER — Telehealth: Payer: Self-pay | Admitting: Family Medicine

## 2018-03-30 NOTE — Telephone Encounter (Signed)
Called and spoke with pt regarding the cancellation of their appt with Dr. Clelia Croft 1.15.20. Unfortunately, Dr. Clelia Croft is out of the office with an injury for the next 6 weeks. Best case scenario, she will be returning the first week of March. I was able to get pt rescheduled with DR. Greene 1.15.20 at 8:00. I advised pt of time, building number and late policy.  I also advised of time, building and late policy. Pt acknowledged.  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 According to the pt, he has waited since June to see Dr. Clelia Croft and is frustrated. I assured him that we understand and are very sorry and Dr. Clelia Croft is truly sorry but we are going to work hard to get you in. I was successful in scheduling an appt and pt acknowledged.

## 2018-04-01 ENCOUNTER — Encounter: Payer: Self-pay | Admitting: Family Medicine

## 2018-04-01 ENCOUNTER — Encounter: Payer: BC Managed Care – PPO | Admitting: Family Medicine

## 2018-04-01 ENCOUNTER — Other Ambulatory Visit: Payer: Self-pay

## 2018-04-01 ENCOUNTER — Ambulatory Visit (INDEPENDENT_AMBULATORY_CARE_PROVIDER_SITE_OTHER): Payer: BC Managed Care – PPO | Admitting: Family Medicine

## 2018-04-01 VITALS — BP 107/73 | HR 66 | Temp 98.0°F | Resp 16 | Ht 69.29 in | Wt 274.0 lb

## 2018-04-01 DIAGNOSIS — R7303 Prediabetes: Secondary | ICD-10-CM | POA: Diagnosis not present

## 2018-04-01 DIAGNOSIS — Z0001 Encounter for general adult medical examination with abnormal findings: Secondary | ICD-10-CM

## 2018-04-01 DIAGNOSIS — R195 Other fecal abnormalities: Secondary | ICD-10-CM

## 2018-04-01 DIAGNOSIS — R61 Generalized hyperhidrosis: Secondary | ICD-10-CM

## 2018-04-01 DIAGNOSIS — Z13220 Encounter for screening for lipoid disorders: Secondary | ICD-10-CM

## 2018-04-01 DIAGNOSIS — Z6841 Body Mass Index (BMI) 40.0 and over, adult: Secondary | ICD-10-CM

## 2018-04-01 DIAGNOSIS — R1084 Generalized abdominal pain: Secondary | ICD-10-CM | POA: Diagnosis not present

## 2018-04-01 DIAGNOSIS — M109 Gout, unspecified: Secondary | ICD-10-CM

## 2018-04-01 DIAGNOSIS — E039 Hypothyroidism, unspecified: Secondary | ICD-10-CM

## 2018-04-01 DIAGNOSIS — Z113 Encounter for screening for infections with a predominantly sexual mode of transmission: Secondary | ICD-10-CM

## 2018-04-01 DIAGNOSIS — Z0000 Encounter for general adult medical examination without abnormal findings: Secondary | ICD-10-CM

## 2018-04-01 NOTE — Patient Instructions (Addendum)
Thank you for coming in today. For the chronic abdominal pain and loose stools I referred you to gastroenterology.  I do not appreciate a specific hernia on exam, but have them evaluate abdomen as well.  We could check a CT scan at some point to verify there is no hernia if needed.  Also discussed the popping sensation with gastroenterology, but that could be due to a full stomach and possible popping from lower rib/diaphragm area.  We can follow-up to discuss that further at future visit.   I am checking the diabetes test, but keep up the good work with exercise, meeting with nutritionist next week.  I really think that will help your numbers.  Also congratulations on quitting smoking.  I have included some information below to help with that process.  Let us know if we can help further.   I will check the gout test and thyroid test, but please follow-up to discuss the warm sensation/flushing feeling further.  If there are other concerns we did not have a chance to discuss today, please bring those with you to next visit so we can come up with a plan on addressing those.  Return to the clinic or go to the nearest emergency room if any of your symptoms worsen or new symptoms occur.  Keeping you healthy  Get these tests  Blood pressure- Have your blood pressure checked once a year by your healthcare provider.  Normal blood pressure is 120/80.  Weight- Have your body mass index (BMI) calculated to screen for obesity.  BMI is a measure of body fat based on height and weight. You can also calculate your own BMI at https://www.west-esparza.com/www.nhlbisupport.com/bmi/.  Cholesterol- Have your cholesterol checked regularly starting at age 41, sooner may be necessary if you have diabetes, high blood pressure, if a family member developed heart diseases at an early age or if you smoke.   Chlamydia, HIV, and other sexual transmitted disease- Get screened each year until the age of 41 then within three months of each new sexual  partner.  Diabetes- Have your blood sugar checked regularly if you have high blood pressure, high cholesterol, a family history of diabetes or if you are overweight.  Get these vaccines  Flu shot- Every fall.  Tetanus shot- Every 10 years.  Menactra- Single dose; prevents meningitis.  Take these steps  Don't smoke- If you do smoke, ask your healthcare provider about quitting. For tips on how to quit, go to www.smokefree.gov or call 1-800-QUIT-NOW.  Be physically active- Exercise 5 days a week for at least 30 minutes.  If you are not already physically active start slow and gradually work up to 30 minutes of moderate physical activity.  Examples of moderate activity include walking briskly, mowing the yard, dancing, swimming bicycling, etc.  Eat a healthy diet- Eat a variety of healthy foods such as fruits, vegetables, low fat milk, low fat cheese, yogurt, lean meats, poultry, fish, beans, tofu, etc.  For more information on healthy eating, go to www.thenutritionsource.org  Drink alcohol in moderation- Limit alcohol intake two drinks or less a day.  Never drink and drive.  Dentist- Brush and floss teeth twice daily; visit your dentis twice a year.  Depression-Your emotional health is as important as your physical health.  If you're feeling down, losing interest in things you normally enjoy please talk with your healthcare provider.  Gun Safety- If you keep a gun in your home, keep it unloaded and with the safety lock on.  Bullets should be stored separately.  Helmet use- Always wear a helmet when riding a motorcycle, bicycle, rollerblading or skateboarding.  Safe sex- If you may be exposed to a sexually transmitted infection, use a condom  Seat belts- Seat bels can save your life; always wear one.  Smoke/Carbon Monoxide detectors- These detectors need to be installed on the appropriate level of your home.  Replace batteries at least once a year.  Skin Cancer- When out in the sun,  cover up and use sunscreen SPF 15 or higher.  Violence- If anyone is threatening or hurting you, please tell your healthcare provider.  If you have lab work done today you will be contacted with your lab results within the next 2 weeks.  If you have not heard from us then please contact us. The fastest way to get your results is to register for My Chart.   IF you received an x-ray today, you will receive an invoice from [facility]. Please contact [facility] at [phone] with questions or concerns regarding your invoice.   IF you received labwork today, you will receive an invoice from [facility]. Please contact [facility] at [phone] with questions or concerns regarding your invoice.   Our billing staff will not be able to assist you with questions regarding bills from these companies.  You will be contacted with the lab results as soon as they are available. The fastest way to get your results is to activate your My Chart account. Instructions are located on the last page of this paperwork. If you have not heard from us regarding the results in 2 weeks, please contact this office.

## 2018-04-01 NOTE — Progress Notes (Signed)
By signing my name below, I,Marc Fisher, attest that this documentation has been prepared under the direction and in the presence of Shade Flood, MD. Electronically Signed: Robbi Garter, Scribe 04/01/2018 at 9:17 AM.  Subjective:  Patient ID: Marc Fisher, male    DOB: [birth year]  Age: 43 y.o. MRN: [id number]  CC:  Chief Complaint  Patient presents with  . Annual Exam    HPI Marc Fisher is a 43 y.o. male that presents today for his annual physical. His primary care provider is Sherren Mocha, MD.  He has a history of prediabetes, hypothyroidism, hypergonadism, obstructive slee apnea, joint pain, tobacco use, and obesity.  Prediabeties Lab Results  Component Value Date   HGBA1C 5.9 (H) 10/02/2017   Weight had increased as above.  Hyperthyroidism Lab Results  Component Value Date   TSH 5.880 (H) 10/02/2017   Synthroid was increased to 112 mcg in 09/2017 with plan for recheck in two months. Patient has not had testing since July.  Patient has recently noted changes by way of hot flashes at times. He denies hair loss, cold intolerance and skin changes.  Obesity Wt Readings from Last 3 Encounters:  04/01/18 274 lb (124.3 kg)  10/02/17 258 lb (117 kg)  12/29/15 264 lb (119.7 kg)   Body mass index is 40.12 kg/m.   Patient is attempting diet modification for weight-loss.  Tobacco Abuse Seen at [facility] on 03/23/2018 and 03/09/2018. Patient reports that he is attempting smoking cessation. His reports his last cigarette was 3 days ago. He is managing his cessation with a nicotine patch (managed through [facility]).  Possible Hernia During the first week of December 2019, following a meal the patient noticed a popping/clicking sound in the upper left side of his abdomen with/during inhalation.  Reports experiencing night sweats intermittently for years. Denies fevers, vomitting, nausea, and blood in stool. No unexplained  weightloss.  Gout He is compliant with his daily Allopurinol 100mg . He reports his last gout flair was around July 2019.  Lab Results  Component Value Date   LABURIC 8.3 10/02/2017   Cancer Screening Patient reports a family history of skin cancer (grandmother). He is followed by dermatology annually.  Patient reports that his grandfather had BPH and his father has had elevated PSA in the past. We discussed PSA screening, but as patient has no family history of prostate cancer he is not at an increased risk at this time, and deferred testing.   Immunizations Immunization History  Administered Date(s) Administered  . Influenza,inj,Quad PF,6+ Mos 01/27/2017  . Influenza-Unspecified 01/31/2016   There is no documentation of a Tdap vaccine for this patient on file. He reports that he believes he has had a Tdap within the last 10 years. He would like to defer at this time while documentation from his previous PCP is obtained and sent over.  HIV Screening Patient has been married to his husband for 5 years. He reports they have been monogomous for 12 years and all prior STD screening has returned negative for both parties. Patient would like to undergo testing today following our discussion.  Depression Screening Depression screen [facility] 2/9 04/01/2018 10/02/2017 12/29/2015  Decreased Interest 0 0 0  Down, Depressed, Hopeless 0 0 0  PHQ - 2 Score 0 0 0   Vision  Patient reports that his last visit to the optomologist was on 03/08/2018. He presents today wearing glasses.   Dental He reports visiting a dentist every 6 months. Denies any issues at  this time.  Excerise Patient is attempting diet modification for weight- loss. He is on his third day of Whole 30. He has an appointment next week with a nutritionist.  Joint Pain He reports persistent joint pain that is most bothersome in the mornings.  History Patient Active Problem List   Diagnosis Date Noted  . Arthralgia 12/25/2017  .  Prediabetes 12/25/2017  . Low back pain radiating to left leg 11/27/2015  . Right distal ureteral calculus 08/21/2015  . Hypothyroidism 07/21/2015  . Hypogonadism in male 07/21/2015  . Obesity 07/21/2015  . Sleep apnea 07/21/2015   Past Medical History:  Diagnosis Date  . Depression   . Pilonidal cyst   . Thyroid disease    Past Surgical History:  Procedure Laterality Date  . PILONIDAL CYST EXCISION    . WISDOM TOOTH EXTRACTION     Allergies  Allergen Reactions  . Penicillins     hives  . Sulfa Antibiotics     Skin blisters and peels.   Prior to Admission medications   Medication Sig Start Date End Date Taking? Authorizing Provider  allopurinol (ZYLOPRIM) 100 MG tablet TAKE 1 TABLET(100 MG) BY MOUTH DAILY 03/23/18  Yes Sherren Mocha, MD  levothyroxine (SYNTHROID, LEVOTHROID) 112 MCG tablet TAKE 1 TABLET BY MOUTH DAILY BEFORE BREAKFAST 03/23/18  Yes Sherren Mocha, MD   Family History  Problem Relation Age of Onset  . Diabetes Mother   . Diabetes Maternal Grandfather   . Hyperlipidemia Paternal Grandfather    Social History   Socioeconomic History  . Marital status: Married    Spouse name: Not on file  . Number of children: Not on file  . Years of education: Not on file  . Highest education level: Not on file  Occupational History  . Not on file  Social Needs  . Financial resource strain: Not on file  . Food insecurity:    Worry: Not on file    Inability: Not on file  . Transportation needs:    Medical: Not on file    Non-medical: Not on file  Tobacco Use  . Smoking status: Never Smoker  . Smokeless tobacco: Never Used  Substance and Sexual Activity  . Alcohol use: Yes  . Drug use: No  . Sexual activity: Not on file  Lifestyle  . Physical activity:    Days per week: Not on file    Minutes per session: Not on file  . Stress: Not on file  Relationships  . Social connections:    Talks on phone: Not on file    Gets together: Not on file    Attends religious  service: Not on file    Active member of club or organization: Not on file    Attends meetings of clubs or organizations: Not on file    Relationship status: Not on file  . Intimate partner violence:    Fear of current or ex partner: Not on file    Emotionally abused: Not on file    Physically abused: Not on file    Forced sexual activity: Not on file  Other Topics Concern  . Not on file  Social History Narrative  . Not on file    Review of Systems  HENT: Negative for dental problem.   Gastrointestinal: Positive for abdominal pain (Reports that it is chronic) and diarrhea. Negative for anal bleeding, blood in stool, nausea and vomiting.       Notices popping/clicking sound in the upper left side  of his abdomen with/during inhalation, only following meals  Endocrine: Positive for heat intolerance (Increased episodes of hot flashes). Negative for cold intolerance.  Musculoskeletal: Positive for arthralgias (persistent and chronic. Most bothersome in the mornings).  Skin: Negative.   Psychiatric/Behavioral:       Patient has a history of sleep apnea    Objective:  BP 107/73   Pulse 66   Temp 98 F (36.7 C) (Oral)   Resp 16   Ht 5' 9.29" (1.76 m)   Wt 274 lb (124.3 kg)   SpO2 96%   BMI 40.12 kg/m   Physical Exam Vitals signs reviewed.  Constitutional:      Appearance: Normal appearance. He is well-developed. He is obese.  HENT:     Head: Normocephalic and atraumatic.     Right Ear: External ear normal.     Left Ear: External ear normal.  Eyes:     Conjunctiva/sclera: Conjunctivae normal.     Pupils: Pupils are equal, round, and reactive to light.     Comments: Patient wears glasses  Neck:     Musculoskeletal: Normal range of motion and neck supple.     Thyroid: No thyromegaly.  Cardiovascular:     Rate and Rhythm: Normal rate and regular rhythm.     Heart sounds: Normal heart sounds.  Pulmonary:     Effort: Pulmonary effort is normal. No respiratory distress.      Breath sounds: Normal breath sounds. No wheezing.  Abdominal:     General: Bowel sounds are normal. There is no distension.     Palpations: Abdomen is soft.     Tenderness: There is abdominal tenderness (diffuse tenderness along the abdomen, complains of worse tenderness in lower abdomen. He reports this pain is chronic and is associated with loose stools).     Hernia: No hernia (I do not appreciate a hernia along the abdominal wall) is present. There is no hernia in the right inguinal area or left inguinal area.     Comments: Popping/clicking sound was not noted on exam  Musculoskeletal: Normal range of motion.        General: No tenderness.  Lymphadenopathy:     Cervical: No cervical adenopathy.  Skin:    General: Skin is warm and dry.  Neurological:     Mental Status: He is alert and oriented to person, place, and time.     Deep Tendon Reflexes: Reflexes are normal and symmetric.  Psychiatric:        Mood and Affect: Mood normal.        Behavior: Behavior normal.        Thought Content: Thought content normal.    Assessment & Plan:   Marc RouseRodney Ales is a 43 y.o. male Annual physical exam  - -anticipatory guidance as below in AVS, screening labs above. Health maintenance items as above in HPI discussed/recommended as applicable.   Hypothyroidism, unspecified type - Plan: TSH  -Flashes, night sweats reported as above.  Check TSH to evaluate control, but advised to follow-up to discuss symptoms further  Class 3 severe obesity with body mass index (BMI) of 40.0 to 44.9 in adult, unspecified obesity type, unspecified whether serious comorbidity present (HCC) - Plan: Hemoglobin A1c Prediabetes  -Commended on diet changes, exercise changes and meeting with nutritionist  Generalized abdominal pain - Plan: Ambulatory referral to Gastroenterology, Lipase, CBC with Differential/Platelet Loose stools - Plan: Ambulatory referral to Gastroenterology  -Recurrent issues of abdominal pain with  loose stools.  Refer to GI.  Check lipase, CBC.  -No hernia appreciated, but can have area evaluated when he sees GI, or possible surgery eval.  Can discuss further at follow-up visit as well.  RTC/ER precautions.  Night sweats - Plan: TSH, Sedimentation Rate, , Ambulatory referral to Gastroenterology, CBC with Differential/Platelet  -Episodic night sweats, GI eval as above.  Check TSH, sed rate, HIV testing as well.  Routine screening for STI (sexually transmitted infection)  -HIV Antibody (routine testing w rflx), RPR, GC/Chlamydia Probe Amp  Screening for hyperlipidemia - Plan: Comprehensive metabolic panel, Lipid panel  Gout, unspecified cause, unspecified chronicity, unspecified site - Plan: Uric Acid  -Check uric acid, no new meds at this time.  Commended on quitting smoking.   No orders of the defined types were placed in this encounter.  Patient Instructions   Thank you for coming in today. For the chronic abdominal pain and loose stools I referred you to gastroenterology.  I do not appreciate a specific hernia on exam, but have them evaluate abdomen as well.  We could check a CT scan at some point to verify there is no hernia if needed.  Also discussed the popping sensation with gastroenterology, but that could be due to a full stomach and possible popping from lower rib/diaphragm area.  We can follow-up to discuss that further at future visit.   I am checking the diabetes test, but keep up the good work with exercise, meeting with nutritionist next week.  I really think that will help your numbers.  Also congratulations on quitting smoking.  I have included some information below to help with that process.  Let us know if we can help further.   I will check the gout test and thyroid test, but please follow-up to discuss the warm sensation/flushing feeling further.  If there are other concerns we did not have a chance to discuss today, please bring those with you to next visit  so we can come up with a plan on addressing those.  Return to the clinic or go to the nearest emergency room if any of your symptoms worsen or new symptoms occur.  Keeping you healthy  Get these tests  Blood pressure- Have your blood pressure checked once a year by your healthcare provider.  Normal blood pressure is 120/80.  Weight- Have your body mass index (BMI) calculated to screen for obesity.  BMI is a measure of body fat based on height and weight. You can also calculate your own BMI at https://www.west-esparza.com/.  Cholesterol- Have your cholesterol checked regularly starting at age 67, sooner may be necessary if you have diabetes, high blood pressure, if a family member developed heart diseases at an early age or if you smoke.   Chlamydia, HIV, and other sexual transmitted disease- Get screened each year until the age of 73 then within three months of each new sexual partner.  Diabetes- Have your blood sugar checked regularly if you have high blood pressure, high cholesterol, a family history of diabetes or if you are overweight.  Get these vaccines  Flu shot- Every fall.  Tetanus shot- Every 10 years.  Menactra- Single dose; prevents meningitis.  Take these steps  Don't smoke- If you do smoke, ask your healthcare provider about quitting. For tips on how to quit, go to www.smokefree.gov or call 1-800-QUIT-NOW.  Be physically active- Exercise 5 days a week for at least 30 minutes.  If you are not already physically active start slow and gradually work up to 30 minutes of  moderate physical activity.  Examples of moderate activity include walking briskly, mowing the yard, dancing, swimming bicycling, etc.  Eat a healthy diet- Eat a variety of healthy foods such as fruits, vegetables, low fat milk, low fat cheese, yogurt, lean meats, poultry, fish, beans, tofu, etc.  For more information on healthy eating, go to www.thenutritionsource.org  Drink alcohol in moderation- Limit  alcohol intake two drinks or less a day.  Never drink and drive.  Dentist- Brush and floss teeth twice daily; visit your dentis twice a year.  Depression-Your emotional health is as important as your physical health.  If you're feeling down, losing interest in things you normally enjoy please talk with your healthcare provider.  Gun Safety- If you keep a gun in your home, keep it unloaded and with the safety lock on.  Bullets should be stored separately.  Helmet use- Always wear a helmet when riding a motorcycle, bicycle, rollerblading or skateboarding.  Safe sex- If you may be exposed to a sexually transmitted infection, use a condom  Seat belts- Seat bels can save your life; always wear one.  Smoke/Carbon Monoxide detectors- These detectors need to be installed on the appropriate level of your home.  Replace batteries at least once a year.  Skin Cancer- When out in the sun, cover up and use sunscreen SPF 15 or higher.  Violence- If anyone is threatening or hurting you, please tell your healthcare provider.  If you have lab work done today you will be contacted with your lab results within the next 2 weeks.  If you have not heard from [place] then please contact us. The fastest way to get your results is to register for My Chart.   IF you received an x-ray today, you will receive an invoice from [facility]. Please contact [facility] at [phone] with questions or concerns regarding your invoice.   IF you received labwork today, you will receive an invoice from [facility]. Please contact [facility] at [phone] with questions or concerns regarding your invoice.   Our billing staff will not be able to assist you with questions regarding bills from these companies.  You will be contacted with the lab results as soon as they are available. The fastest way to get your results is to activate your My Chart account. Instructions are located on the last page of this paperwork.  If you have not heard from [place] regarding the results in 2 weeks, please contact this office.       I personally performed the services described in this documentation, which was scribed in my presence. The recorded information has been reviewed and considered for accuracy and completeness, addended by me as needed, and agree with information above.  Signed,   Meredith Staggers, MD [facility] at [facility].  04/05/18 8:07 AM

## 2018-04-02 LAB — COMPREHENSIVE METABOLIC PANEL
ALT: 56 IU/L — ABNORMAL HIGH (ref 0–44)
AST: 35 IU/L (ref 0–40)
Albumin/Globulin Ratio: 2.1 (ref 1.2–2.2)
Albumin: 4.9 g/dL (ref 3.5–5.5)
Alkaline Phosphatase: 78 IU/L (ref 39–117)
BUN/Creatinine Ratio: 14 (ref 9–20)
BUN: 22 mg/dL (ref 6–24)
Bilirubin Total: 0.6 mg/dL (ref 0.0–1.2)
CO2: 22 mmol/L (ref 20–29)
Calcium: 9.9 mg/dL (ref 8.7–10.2)
Chloride: 100 mmol/L (ref 96–106)
Creatinine, Ser: 1.54 mg/dL — ABNORMAL HIGH (ref 0.76–1.27)
GFR calc Af Amer: 64 mL/min/{1.73_m2} (ref >59)
GFR calc non Af Amer: 56 mL/min/{1.73_m2} — ABNORMAL LOW (ref >59)
Globulin, Total: 2.3 g/dL (ref 1.5–4.5)
Glucose: 96 mg/dL (ref 65–99)
Potassium: 4.8 mmol/L (ref 3.5–5.2)
Sodium: 141 mmol/L (ref 134–144)
Total Protein: 7.2 g/dL (ref 6.0–8.5)

## 2018-04-02 LAB — CBC WITH DIFFERENTIAL/PLATELET
Basophils Absolute: 0.1 10*3/uL (ref 0.0–0.2)
Basos: 1 %
EOS (ABSOLUTE): 0.2 10*3/uL (ref 0.0–0.4)
Eos: 3 %
Hematocrit: 47.8 % (ref 37.5–51.0)
Hemoglobin: 15.8 g/dL (ref 13.0–17.7)
Immature Grans (Abs): 0 10*3/uL (ref 0.0–0.1)
Immature Granulocytes: 1 %
Lymphocytes Absolute: 2.2 10*3/uL (ref 0.7–3.1)
Lymphs: 38 %
MCH: 27.5 pg (ref 26.6–33.0)
MCHC: 33.1 g/dL (ref 31.5–35.7)
MCV: 83 fL (ref 79–97)
Monocytes Absolute: 0.8 10*3/uL (ref 0.1–0.9)
Monocytes: 14 %
Neutrophils Absolute: 2.6 10*3/uL (ref 1.4–7.0)
Neutrophils: 43 %
Platelets: 231 10*3/uL (ref 150–450)
RBC: 5.75 x10E6/uL (ref 4.14–5.80)
RDW: 14.2 % (ref 11.6–15.4)
WBC: 5.9 10*3/uL (ref 3.4–10.8)

## 2018-04-02 LAB — HEMOGLOBIN A1C
Est. average glucose Bld gHb Est-mCnc: 131 mg/dL
Hgb A1c MFr Bld: 6.2 % — ABNORMAL HIGH (ref 4.8–5.6)

## 2018-04-02 LAB — LIPID PANEL
Chol/HDL Ratio: 3.7 ratio (ref 0.0–5.0)
Cholesterol, Total: 146 mg/dL (ref 100–199)
HDL: 40 mg/dL (ref >39)
LDL Calculated: 91 mg/dL (ref 0–99)
Triglycerides: 76 mg/dL (ref 0–149)
VLDL Cholesterol Cal: 15 mg/dL (ref 5–40)

## 2018-04-02 LAB — GC/CHLAMYDIA PROBE AMP
Chlamydia trachomatis, NAA: NEGATIVE
Neisseria gonorrhoeae by PCR: NEGATIVE

## 2018-04-02 LAB — LIPASE: Lipase: 16 U/L (ref 13–78)

## 2018-04-02 LAB — SEDIMENTATION RATE: Sed Rate: 10 mm/hr (ref 0–15)

## 2018-04-02 LAB — TSH: TSH: 1.52 u[IU]/mL (ref 0.450–4.500)

## 2018-04-02 LAB — HIV ANTIBODY (ROUTINE TESTING W REFLEX): HIV Screen 4th Generation wRfx: NONREACTIVE

## 2018-04-02 LAB — URIC ACID: Uric Acid: 7.8 mg/dL (ref 3.7–8.6)

## 2018-04-02 LAB — RPR: RPR Ser Ql: NONREACTIVE

## 2018-04-13 ENCOUNTER — Encounter: Payer: Self-pay | Admitting: Family Medicine

## 2018-04-14 ENCOUNTER — Telehealth: Payer: Self-pay | Admitting: Family Medicine

## 2018-04-14 NOTE — Telephone Encounter (Signed)
Copied from CRM [id number]. Topic: General - Inquiry >> Apr 14, 2018  9:34 AM Baldo Daub L wrote: Reason for CRM:   Pt calling to check on status of MyChart message he sent yesterday (04/13/2018). Pt also wants to Probenecid would be good for him. Pt can be reached at [phone] >> Apr 14, 2018 11:27 AM Percival Spanish wrote:   Following up on his req for doxycycline  He said he is in intense pain and this med today

## 2018-04-14 NOTE — Telephone Encounter (Signed)
Copied from CRM [id number]. Topic: General - Inquiry >> Apr 14, 2018  9:34 AM Baldo Daub L wrote: Reason for CRM:   Pt calling to check on status of MyChart message he sent yesterday (04/13/2018). Pt also wants to Probenecid would be good for him. Pt can be reached at [phone]

## 2018-04-14 NOTE — Telephone Encounter (Signed)
Patient called in for meds for his gout flare up in his knee. Patient hasnt been prescribed med by and pcp in office offered pt an appt for the morning patient refused.

## 2018-04-15 MED ORDER — COLCHICINE 0.6 MG PO TABS: 0.6000 mg | ORAL_TABLET | Freq: Two times a day (BID) | ORAL | 0 refills | Status: DC | PRN

## 2018-04-17 NOTE — Telephone Encounter (Signed)
Please advise 

## 2018-04-17 NOTE — Telephone Encounter (Signed)
This was addressed by Mychart message by Dr. Leretha Pol on 1/29.

## 2018-04-18 NOTE — Telephone Encounter (Signed)
See mychart message from Dr. Leretha Pol. Already handled.

## 2018-04-28 ENCOUNTER — Ambulatory Visit: Payer: BC Managed Care – PPO | Admitting: Family Medicine

## 2018-05-08 ENCOUNTER — Other Ambulatory Visit: Payer: Self-pay | Admitting: Family Medicine

## 2018-05-14 ENCOUNTER — Encounter: Payer: Self-pay | Admitting: Emergency Medicine

## 2018-05-14 ENCOUNTER — Ambulatory Visit: Payer: BC Managed Care – PPO | Admitting: Emergency Medicine

## 2018-05-14 VITALS — BP 107/70 | HR 73 | Temp 98.3°F | Resp 16 | Ht 67.5 in | Wt 266.2 lb

## 2018-05-14 DIAGNOSIS — E039 Hypothyroidism, unspecified: Secondary | ICD-10-CM | POA: Diagnosis not present

## 2018-05-14 DIAGNOSIS — M2550 Pain in unspecified joint: Secondary | ICD-10-CM

## 2018-05-14 DIAGNOSIS — Z8739 Personal history of other diseases of the musculoskeletal system and connective tissue: Secondary | ICD-10-CM

## 2018-05-14 DIAGNOSIS — G4730 Sleep apnea, unspecified: Secondary | ICD-10-CM

## 2018-05-14 DIAGNOSIS — R7303 Prediabetes: Secondary | ICD-10-CM

## 2018-05-14 NOTE — Patient Instructions (Addendum)
   If you have lab work done today you will be contacted with your lab results within the next 2 weeks.  If you have not heard from us then please contact us. The fastest way to get your results is to register for My Chart.   IF you received an x-ray today, you will receive an invoice from [facility]. Please contact [facility] at [phone] with questions or concerns regarding your invoice.   IF you received labwork today, you will receive an invoice from [facility]. Please contact [facility] at [phone] with questions or concerns regarding your invoice.   Our billing staff will not be able to assist you with questions regarding bills from these companies.  You will be contacted with the lab results as soon as they are available. The fastest way to get your results is to activate your My Chart account. Instructions are located on the last page of this paperwork. If you have not heard from us regarding the results in 2 weeks, please contact this office.     Joint Pain  Joint pain can be caused by many things. It is likely to go away if you follow instructions from your doctor for taking care of yourself at home. Sometimes, you may need more treatment. Follow these instructions at home: Managing pain, stiffness, and swelling   If told, put ice on the painful area. ? Put ice in a plastic bag. ? Place a towel between your skin and the bag. ? Leave the ice on for 20 minutes, 2-3 times a day.  If told, put heat on the painful area. Do this as often as told by your doctor. Use the heat source that your doctor recommends, such as a moist heat pack or a heating pad. ? Place a towel between your skin and the heat source. ? Leave the heat on for 20-30 minutes. ? Take off the heat if your skin gets bright red. This is especially important if you are unable to feel pain, heat, or cold. You may have a greater risk of getting burned.  Move your fingers or toes below the  painful joint often. This helps with stiffness and swelling.  If possible, raise (elevate) the painful joint above the level of your heart while you are sitting or lying down. To do this, try putting a few pillows under the painful joint. Activity  Rest the painful joint for as long as told. Do not do things that cause pain or make your pain worse.  Begin exercising or stretching the affected area, as told by your doctor. Ask your doctor what types of exercise are safe for you. If you have an elastic bandage, sling, or splint:  Wear the device as told by your doctor. Take it off only as told by your doctor.  Loosen the device if your fingers or toes below the joint: ? Tingle. ? Lose feeling (get numb). ? Get cold and blue.  Keep the device clean.  Ask your doctor if you should take off the device before bathing. You may need to cover it with a watertight covering when you take a bath or a shower. General instructions  Take over-the-counter and prescription medicines only as told by your doctor.  Do not use any products that contain nicotine or tobacco. These include cigarettes and e-cigarettes. If you need help quitting, ask your doctor.  Keep all follow-up visits as told by your doctor. This is important. Contact a doctor if:  You have   pain that gets worse and does not get better with medicine.  Your joint pain does not get better in 3 days.  You have more bruising or swelling.  You have a fever.  You lose 10 lb (4.5 kg) or more without trying. Get help right away if:  You cannot move the joint.  Your fingers or toes tingle, lose feeling, or get cold and blue.  You have a fever along with a joint that is red, warm, and swollen. Summary  Joint pain can be caused by many things. It often goes away if you follow instructions from your doctor for taking care of yourself at home.  Rest the painful joint for as long as told. Do not do things that cause pain or make your  pain worse.  Take over-the-counter and prescription medicines only as told by your doctor. This information is not intended to replace advice given to you by your health care provider. Make sure you discuss any questions you have with your health care provider. Document Released: 02/20/2009 Document Revised: 12/18/2016 Document Reviewed: 12/18/2016 Elsevier Interactive Patient Education  2019 [employer].

## 2018-05-14 NOTE — Progress Notes (Signed)
Marc Fisher 43 y.o.   Chief Complaint  Patient presents with  . Joint Swelling    JOINT PAIN per patient for a long time and patient wants to discuss results of lab 04/01/2018    HISTORY OF PRESENT ILLNESS: This is a 43 y.o. male with multiple complaints: 1.  Scalded hands. 2.  Bursting calves. 3.  Constant tight muscles. 4.  Lack of mobility. 5.  Joint pains. 6.  Gastroesophageal hernia. 7.  Severe temperature changes. 8.  Night sweats. 9.  Severe fatigue. Has history of gout, hypothyroidism, kidney stones, and prediabetes. Recent blood work showed mild elevation of liver enzyme with mild elevation of creatinine and slightly diminished GFR along with prediabetes. High stress job.   HPI   Prior to Admission medications   Medication Sig Start Date End Date Taking? Authorizing Provider  allopurinol (ZYLOPRIM) 100 MG tablet TAKE 1 TABLET(100 MG) BY MOUTH DAILY 03/23/18  Yes Sherren Mocha, MD  colchicine 0.6 MG tablet TAKE 1 TABLET(0.6 MG) BY MOUTH TWICE DAILY AS NEEDED 05/11/18  Yes Myles Lipps, MD  levothyroxine (SYNTHROID, LEVOTHROID) 112 MCG tablet TAKE 1 TABLET BY MOUTH DAILY BEFORE BREAKFAST 03/23/18  Yes Sherren Mocha, MD    Allergies  Allergen Reactions  . Penicillins     hives  . Sulfa Antibiotics     Skin blisters and peels.    Patient Active Problem List   Diagnosis Date Noted  . Arthralgia 12/25/2017  . Prediabetes 12/25/2017  . Right distal ureteral calculus 08/21/2015  . Hypothyroidism 07/21/2015  . Hypogonadism in male 07/21/2015  . Obesity 07/21/2015  . Sleep apnea 07/21/2015    Past Medical History:  Diagnosis Date  . Depression   . Pilonidal cyst   . Thyroid disease     Past Surgical History:  Procedure Laterality Date  . PILONIDAL CYST EXCISION    . WISDOM TOOTH EXTRACTION      Social History   Socioeconomic History  . Marital status: Married    Spouse name: Not on file  . Number of children: Not on file  . Years of education: Not  on file  . Highest education level: Not on file  Occupational History  . Not on file  Social Needs  . Financial resource strain: Not on file  . Food insecurity:    Worry: Not on file    Inability: Not on file  . Transportation needs:    Medical: Not on file    Non-medical: Not on file  Tobacco Use  . Smoking status: Never Smoker  . Smokeless tobacco: Never Used  Substance and Sexual Activity  . Alcohol use: Yes  . Drug use: No  . Sexual activity: Not on file  Lifestyle  . Physical activity:    Days per week: Not on file    Minutes per session: Not on file  . Stress: Not on file  Relationships  . Social connections:    Talks on phone: Not on file    Gets together: Not on file    Attends religious service: Not on file    Active member of club or organization: Not on file    Attends meetings of clubs or organizations: Not on file    Relationship status: Not on file  . Intimate partner violence:    Fear of current or ex partner: Not on file    Emotionally abused: Not on file    Physically abused: Not on file    Forced sexual activity:  Not on file  Other Topics Concern  . Not on file  Social History Narrative  . Not on file    Family History  Problem Relation Age of Onset  . Diabetes Mother   . Diabetes Maternal Grandfather   . Hyperlipidemia Paternal Grandfather      Review of Systems  Constitutional: Negative.  Negative for chills and fever.  HENT: Negative.  Negative for sore throat.   Eyes: Negative.  Negative for blurred vision.  Respiratory: Negative.  Negative for cough, hemoptysis and shortness of breath.   Cardiovascular: Negative.  Negative for chest pain and palpitations.  Gastrointestinal: Positive for constipation.  Genitourinary: Negative.   Musculoskeletal: Positive for joint pain.  Skin: Negative.  Negative for rash.  Neurological: Negative for dizziness.  Endo/Heme/Allergies: Negative.   All other systems reviewed and are negative.  Vitals:    05/14/18 1523  BP: 107/70  Pulse: 73  Resp: 16  Temp: 98.3 F (36.8 C)  SpO2: 94%     Physical Exam Vitals signs reviewed.  Constitutional:      Appearance: Normal appearance.  HENT:     Head: Normocephalic and atraumatic.     Nose: Nose normal.     Mouth/Throat:     Mouth: Mucous membranes are moist.     Pharynx: Oropharynx is clear.  Eyes:     Extraocular Movements: Extraocular movements intact.     Conjunctiva/sclera: Conjunctivae normal.     Pupils: Pupils are equal, round, and reactive to light.  Neck:     Musculoskeletal: Normal range of motion and neck supple.  Cardiovascular:     Rate and Rhythm: Normal rate and regular rhythm.     Pulses: Normal pulses.     Heart sounds: Normal heart sounds.  Pulmonary:     Effort: Pulmonary effort is normal.     Breath sounds: Normal breath sounds.  Abdominal:     General: There is no distension.     Palpations: Abdomen is soft. There is no mass.     Tenderness: There is no abdominal tenderness.  Musculoskeletal: Normal range of motion.     Comments: Positive tophi left index finger  Skin:    General: Skin is warm and dry.     Capillary Refill: Capillary refill takes less than 2 seconds.  Neurological:     General: No focal deficit present.     Mental Status: He is alert and oriented to person, place, and time.  Psychiatric:        Mood and Affect: Mood normal.        Behavior: Behavior normal.      ASSESSMENT & PLAN: Osby was seen today for joint swelling.  Diagnoses and all orders for this visit:  Arthralgia, unspecified joint -     Comprehensive metabolic panel -     ANA,IFA RA Diag Pnl w/rflx Tit/Patn -     Ambulatory referral to Rheumatology  Hypothyroidism, unspecified type  History of gout  Sleep apnea, unspecified type  Prediabetes    Patient Instructions       If you have lab work done today you will be contacted with your lab results within the next 2 weeks.  If you have not heard  from [place] then please contact us. The fastest way to get your results is to register for My Chart.   IF you received an x-ray today, you will receive an invoice from [facility]. Please contact [facility] at [phone] with questions or concerns regarding  your invoice.   IF you received labwork today, you will receive an invoice from [facility]. Please contact [facility] at [phone] with questions or concerns regarding your invoice.   Our billing staff will not be able to assist you with questions regarding bills from these companies.  You will be contacted with the lab results as soon as they are available. The fastest way to get your results is to activate your My Chart account. Instructions are located on the last page of this paperwork. If you have not heard from [place] regarding the results in 2 weeks, please contact this office.     Joint Pain  Joint pain can be caused by many things. It is likely to go away if you follow instructions from your doctor for taking care of yourself at home. Sometimes, you may need more treatment. Follow these instructions at home: Managing pain, stiffness, and swelling   If told, put ice on the painful area. ? Put ice in a plastic bag. ? Place a towel between your skin and the bag. ? Leave the ice on for 20 minutes, 2-3 times a day.  If told, put heat on the painful area. Do this as often as told by your doctor. Use the heat source that your doctor recommends, such as a moist heat pack or a heating pad. ? Place a towel between your skin and the heat source. ? Leave the heat on for 20-30 minutes. ? Take off the heat if your skin gets bright red. This is especially important if you are unable to feel pain, heat, or cold. You may have a greater risk of getting burned.  Move your fingers or toes below the painful joint often. This helps with stiffness and swelling.  If possible, raise (elevate) the painful joint above the level of your  heart while you are sitting or lying down. To do this, try putting a few pillows under the painful joint. Activity  Rest the painful joint for as long as told. Do not do things that cause pain or make your pain worse.  Begin exercising or stretching the affected area, as told by your doctor. Ask your doctor what types of exercise are safe for you. If you have an elastic bandage, sling, or splint:  Wear the device as told by your doctor. Take it off only as told by your doctor.  Loosen the device if your fingers or toes below the joint: ? Tingle. ? Lose feeling (get numb). ? Get cold and blue.  Keep the device clean.  Ask your doctor if you should take off the device before bathing. You may need to cover it with a watertight covering when you take a bath or a shower. General instructions  Take over-the-counter and prescription medicines only as told by your doctor.  Do not use any products that contain nicotine or tobacco. These include cigarettes and e-cigarettes. If you need help quitting, ask your doctor.  Keep all follow-up visits as told by your doctor. This is important. Contact a doctor if:  You have pain that gets worse and does not get better with medicine.  Your joint pain does not get better in 3 days.  You have more bruising or swelling.  You have a fever.  You lose 10 lb (4.5 kg) or more without trying. Get help right away if:  You cannot move the joint.  Your fingers or toes tingle, lose feeling, or get cold and blue.  You have a fever along  with a joint that is red, warm, and swollen. Summary  Joint pain can be caused by many things. It often goes away if you follow instructions from your doctor for taking care of yourself at home.  Rest the painful joint for as long as told. Do not do things that cause pain or make your pain worse.  Take over-the-counter and prescription medicines only as told by your doctor. This information is not intended to replace  advice given to you by your health care provider. Make sure you discuss any questions you have with your health care provider. Document Released: 02/20/2009 Document Revised: 12/18/2016 Document Reviewed: 12/18/2016 Elsevier Interactive Patient Education  2019 Elsevier Inc.      Edwina Barth, MD [facility]

## 2018-05-15 ENCOUNTER — Telehealth: Payer: Self-pay | Admitting: Emergency Medicine

## 2018-05-15 ENCOUNTER — Encounter: Payer: Self-pay | Admitting: Emergency Medicine

## 2018-05-15 ENCOUNTER — Other Ambulatory Visit: Payer: Self-pay | Admitting: Emergency Medicine

## 2018-05-15 DIAGNOSIS — R944 Abnormal results of kidney function studies: Secondary | ICD-10-CM

## 2018-05-15 NOTE — Telephone Encounter (Signed)
Spoke to [place] at [employer] , when [employer] she is going to have a provider look over, explained it was URGENT 05/15/2018

## 2018-05-16 LAB — COMPREHENSIVE METABOLIC PANEL
A/G RATIO: 2.1 (ref 1.2–2.2)
ALT: 45 IU/L — ABNORMAL HIGH (ref 0–44)
AST: 21 IU/L (ref 0–40)
Albumin: 4.6 g/dL (ref 4.0–5.0)
Alkaline Phosphatase: 79 IU/L (ref 39–117)
BUN/Creatinine Ratio: 12 (ref 9–20)
BUN: 21 mg/dL (ref 6–24)
Bilirubin Total: 0.5 mg/dL (ref 0.0–1.2)
CO2: 25 mmol/L (ref 20–29)
Calcium: 9.6 mg/dL (ref 8.7–10.2)
Chloride: 104 mmol/L (ref 96–106)
Creatinine, Ser: 1.82 mg/dL — ABNORMAL HIGH (ref 0.76–1.27)
GFR calc Af Amer: 53 mL/min/{1.73_m2} — ABNORMAL LOW (ref >59)
GFR calc non Af Amer: 45 mL/min/{1.73_m2} — ABNORMAL LOW (ref >59)
Globulin, Total: 2.2 g/dL (ref 1.5–4.5)
Glucose: 93 mg/dL (ref 65–99)
Potassium: 4.5 mmol/L (ref 3.5–5.2)
Sodium: 143 mmol/L (ref 134–144)
Total Protein: 6.8 g/dL (ref 6.0–8.5)

## 2018-05-16 LAB — ANA,IFA RA DIAG PNL W/RFLX TIT/PATN
ANA Titer 1: NEGATIVE
Cyclic Citrullin Peptide Ab: 8 units (ref 0–19)
Rheumatoid fact SerPl-aCnc: <10 IU/mL (ref 0.0–13.9)

## 2018-05-17 ENCOUNTER — Encounter: Payer: Self-pay | Admitting: Emergency Medicine

## 2018-05-18 ENCOUNTER — Telehealth: Payer: Self-pay | Admitting: Emergency Medicine

## 2018-05-18 ENCOUNTER — Other Ambulatory Visit: Payer: Self-pay | Admitting: Emergency Medicine

## 2018-05-18 DIAGNOSIS — R944 Abnormal results of kidney function studies: Secondary | ICD-10-CM

## 2018-05-18 NOTE — Telephone Encounter (Signed)
Spoke to patient and he stated someone had already called him.  I was the 3rd person to call. Patient understood

## 2018-05-18 NOTE — Telephone Encounter (Signed)
Spoke to [employer] , they are requesting more urinalysis test be done  Sent provider a note

## 2018-05-18 NOTE — Telephone Encounter (Signed)
Urine tests and renal ultrasound ordered.  Please call patient to notify.  Thanks.

## 2018-05-18 NOTE — Telephone Encounter (Signed)
CALLED PATIENT TO INFORM HIM TO COME IN AND GET HIS LAB WORK DONE  LVM  HE WILL ALSO NEED A RENAL US -- SENT TO [facility] [phone]

## 2018-05-18 NOTE — Telephone Encounter (Signed)
Spoke to [place] [phone]  at [facility] Provide there is asking that PCP do a Urinalysis , urine protein/ creaton / and renal US  This week  If you have any questions you can call her   Thank you

## 2018-05-19 ENCOUNTER — Ambulatory Visit (INDEPENDENT_AMBULATORY_CARE_PROVIDER_SITE_OTHER): Payer: BC Managed Care – PPO | Admitting: Emergency Medicine

## 2018-05-19 DIAGNOSIS — R944 Abnormal results of kidney function studies: Secondary | ICD-10-CM

## 2018-05-19 NOTE — Progress Notes (Signed)
Lab only visit 

## 2018-05-20 ENCOUNTER — Ambulatory Visit
Admission: RE | Admit: 2018-05-20 | Discharge: 2018-05-20 | Disposition: A | Discharge status: Home / Self Care | Payer: BC Managed Care – PPO | Source: Ambulatory Visit | Attending: Emergency Medicine | Admitting: Emergency Medicine

## 2018-05-20 DIAGNOSIS — R944 Abnormal results of kidney function studies: Secondary | ICD-10-CM

## 2018-05-20 LAB — URINALYSIS
Bilirubin, UA: NEGATIVE
Glucose, UA: NEGATIVE
Ketones, UA: NEGATIVE
Leukocytes, UA: NEGATIVE
Nitrite, UA: NEGATIVE
Protein, UA: NEGATIVE
RBC, UA: NEGATIVE
Specific Gravity, UA: 1.028 (ref 1.005–1.030)
Urobilinogen, Ur: 0.2 mg/dL (ref 0.2–1.0)
pH, UA: 5.5 (ref 5.0–7.5)

## 2018-05-20 LAB — PROTEIN / CREATININE RATIO, URINE
Creatinine, Urine: 253.4 mg/dL
PROTEIN UR: 13.1 mg/dL
Protein/Creat Ratio: 52 mg/g creat (ref 0–200)

## 2018-05-27 ENCOUNTER — Encounter: Payer: Self-pay | Admitting: Family Medicine

## 2018-05-28 ENCOUNTER — Other Ambulatory Visit: Payer: Self-pay | Admitting: Emergency Medicine

## 2018-05-28 ENCOUNTER — Encounter: Payer: Self-pay | Admitting: Emergency Medicine

## 2018-05-28 MED ORDER — PREDNISONE 20 MG PO TABS: 40.0000 mg | ORAL_TABLET | Freq: Every day | ORAL | 0 refills | Status: AC

## 2018-06-02 ENCOUNTER — Encounter: Payer: Self-pay | Admitting: Emergency Medicine

## 2018-06-20 ENCOUNTER — Other Ambulatory Visit: Payer: Self-pay | Admitting: Family Medicine

## 2018-07-06 ENCOUNTER — Other Ambulatory Visit: Payer: Self-pay | Admitting: Physician Assistant

## 2018-07-06 DIAGNOSIS — M25561 Pain in right knee: Secondary | ICD-10-CM

## 2018-07-14 ENCOUNTER — Ambulatory Visit
Admission: RE | Admit: 2018-07-14 | Discharge: 2018-07-14 | Disposition: A | Discharge status: Home / Self Care | Payer: BC Managed Care – PPO | Source: Ambulatory Visit | Attending: Physician Assistant | Admitting: Physician Assistant

## 2018-07-14 ENCOUNTER — Other Ambulatory Visit: Payer: Self-pay

## 2018-07-14 DIAGNOSIS — M25561 Pain in right knee: Secondary | ICD-10-CM

## 2018-08-25 NOTE — Telephone Encounter (Signed)
done

## 2018-10-05 ENCOUNTER — Telehealth: Payer: Self-pay | Admitting: Emergency Medicine

## 2018-10-05 NOTE — Telephone Encounter (Signed)
Pt is wanting refills on levothyroxine (SYNTHROID, LEVOTHROID) 112 MCG tablet  Sent to [facility] in [place] .only has 6 days left on Levo   and Nicoderm CQ step 2   FR

## 2019-01-13 ENCOUNTER — Other Ambulatory Visit: Payer: Self-pay

## 2019-01-13 DIAGNOSIS — Z20822 Contact with and (suspected) exposure to covid-19: Secondary | ICD-10-CM

## 2019-01-15 LAB — NOVEL CORONAVIRUS, NAA: SARS-CoV-2, NAA: NOT DETECTED

## 2019-01-22 ENCOUNTER — Other Ambulatory Visit: Payer: Self-pay

## 2019-01-22 DIAGNOSIS — Z20822 Contact with and (suspected) exposure to covid-19: Secondary | ICD-10-CM

## 2019-01-24 LAB — NOVEL CORONAVIRUS, NAA: SARS-CoV-2, NAA: NOT DETECTED

## 2019-05-13 ENCOUNTER — Ambulatory Visit: Payer: BC Managed Care – PPO | Attending: Internal Medicine

## 2019-05-13 DIAGNOSIS — Z23 Encounter for immunization: Secondary | ICD-10-CM | POA: Insufficient documentation

## 2019-05-13 NOTE — Progress Notes (Signed)
   Covid-19 Vaccination Clinic  Name:  Marc Fisher    MRN: [id number] DOB: Jul 15, 1975  05/13/2019  [name] was observed post Covid-19 immunization for 15 minutes without incidence. He was provided with Vaccine Information Sheet and instruction to access the V-Safe system.   [name] was instructed to call 911 with any severe reactions post vaccine: Marland Kitchen Difficulty breathing  . Swelling of your face and throat  . A fast heartbeat  . A bad rash all over your body  . Dizziness and weakness    Immunizations Administered    Name Date Dose VIS Date Route   Pfizer COVID-19 Vaccine 05/13/2019  1:06 PM 0.3 mL 02/26/2019 Intramuscular   Manufacturer: [employer], [employer]   Lot: [id number]   NDC: [address]-5

## 2019-06-09 ENCOUNTER — Ambulatory Visit: Payer: BC Managed Care – PPO | Attending: Internal Medicine

## 2019-06-09 DIAGNOSIS — Z23 Encounter for immunization: Secondary | ICD-10-CM

## 2019-06-09 NOTE — Progress Notes (Signed)
   Covid-19 Vaccination Clinic  Name:  Marc Fisher    MRN: [id number] DOB: [birth year]  06/09/2019  [name] was observed post Covid-19 immunization for 30 minutes based on pre-vaccination screening without incident. He was provided with Vaccine Information Sheet and instruction to access the V-Safe system.   [name] was instructed to call 911 with any severe reactions post vaccine: Marland Kitchen Difficulty breathing  . Swelling of face and throat  . A fast heartbeat  . A bad rash all over body  . Dizziness and weakness   Immunizations Administered    Name Date Dose VIS Date Route   Pfizer COVID-19 Vaccine 06/09/2019  8:26 AM 0.3 mL 02/26/2019 Intramuscular   Manufacturer: [employer], [employer]   Lot: [id number]   NDC: [address]-6

## 2020-03-29 LAB — HM DIABETES EYE EXAM

## 2020-07-07 ENCOUNTER — Encounter: Payer: Self-pay | Admitting: Neurology

## 2020-09-25 ENCOUNTER — Other Ambulatory Visit: Payer: Self-pay

## 2020-09-25 ENCOUNTER — Ambulatory Visit: Payer: BC Managed Care – PPO | Admitting: Neurology

## 2020-09-25 ENCOUNTER — Encounter: Payer: Self-pay | Admitting: Neurology

## 2020-09-25 VITALS — BP 112/74 | HR 73 | Ht 67.5 in | Wt 286.0 lb

## 2020-09-25 DIAGNOSIS — R109 Unspecified abdominal pain: Secondary | ICD-10-CM | POA: Diagnosis not present

## 2020-09-25 DIAGNOSIS — M79602 Pain in left arm: Secondary | ICD-10-CM

## 2020-09-25 DIAGNOSIS — M79601 Pain in right arm: Secondary | ICD-10-CM | POA: Diagnosis not present

## 2020-09-25 DIAGNOSIS — R202 Paresthesia of skin: Secondary | ICD-10-CM

## 2020-09-25 NOTE — Progress Notes (Signed)
[facility] Clinic Note - Initial Visit   Date: 09/25/20  Marc Fisher MRN: [id number] DOB: 1976/01/31   Dear Dr. Katrinka Blazing:  Thank you for your kind referral of Marc Fisher for consultation of arm paresthesias. Although his history is well known to you, please allow [place] to reiterate it for the purpose of our medical record. The patient was accompanied to the clinic by husband who also provides collateral information.     History of Present Illness: Marc Fisher is a 45 y.o. left-handed male with Osgood-Schlatter's disease, frontal meningioma, hypothyroidism, GERD, and gout presenting for evaluation of arm numbness/tingling.  Starting in April 2022, he began having numbness and tingling over the arms from the elbows into the hands.  He was also having sharp pain over the hands and hand weakness.  Sometimes, he would wake up with his arms curled and unable to open them.  He has tried massage therapy which helps. His symptoms are not as bothersome anymore.    He is mostly bothered by right flank pain, described as sharp. Pain is worse with movement, such as bending.  He did not get any relief with Aleve or a muscle relaxant.    He works part-time in Programme researcher, broadcasting/film/video.    Out-side paper records, electronic medical record, and images have been reviewed where available and summarized as:  MRI brain wwo contrast 02/01/2020:   1.  Stable left frontal meningioma compared with 08/05/2019 MRI.   Lab Results  Component Value Date   HGBA1C 6.2 (H) 04/01/2018   Lab Results  Component Value Date   VITAMINB12 954 10/02/2017   Lab Results  Component Value Date   TSH 1.520 04/01/2018   Lab Results  Component Value Date   ESRSEDRATE 10 04/01/2018    Past Medical History:  Diagnosis Date   Depression    Pilonidal cyst    Thyroid disease     Past Surgical History:  Procedure Laterality Date   PILONIDAL CYST EXCISION     WISDOM TOOTH EXTRACTION        Medications:  Outpatient Encounter Medications as of 09/25/2020  Medication Sig   allopurinol (ZYLOPRIM) 100 MG tablet TAKE 1 TABLET(100 MG) BY MOUTH DAILY   levothyroxine (SYNTHROID, LEVOTHROID) 112 MCG tablet TAKE 1 TABLET BY MOUTH DAILY BEFORE BREAKFAST   pantoprazole (PROTONIX) 40 MG tablet Take by mouth daily.   colchicine 0.6 MG tablet TAKE 1 TABLET(0.6 MG) BY MOUTH TWICE DAILY AS NEEDED (Patient not taking: Reported on 09/25/2020)   No facility-administered encounter medications on file as of 09/25/2020.    Allergies:  Allergies  Allergen Reactions   Penicillins     hives   Sulfa Antibiotics     Skin blisters and peels.    Family History: Family History  Problem Relation Age of Onset   Diabetes Mother    Diabetes Maternal Grandfather    Hyperlipidemia Paternal Grandfather     Social History: Social History   Tobacco Use   Smoking status: Never   Smokeless tobacco: Never  Substance Use Topics   Alcohol use: Yes   Drug use: No   Social History   Social History Narrative   Left Handed    Lives in a two story home     Vital Signs:  BP 112/74   Pulse 73   Ht 5' 7.5" (1.715 m)   Wt 286 lb (129.7 kg)   SpO2 98%   BMI 44.13 kg/m    Neurological Exam: MENTAL STATUS including orientation  to time, place, person, recent and remote memory, attention span and concentration, language, and fund of knowledge is normal.  Speech is not dysarthric.  CRANIAL NERVES: II:  No visual field defects.    III-IV-VI: Pupils equal round and reactive to light.  Normal conjugate, extra-ocular eye movements in all directions of gaze.  No nystagmus.  No ptosis.   V:  Normal facial sensation.    VII:  Normal facial symmetry and movements.   VIII:  Normal hearing and vestibular function.   IX-X:  Normal palatal movement.   XI:  Normal shoulder shrug and head rotation.   XII:  Normal tongue strength and range of motion, no deviation or fasciculation.  MOTOR:  No atrophy,  fasciculations or abnormal movements.  No pronator drift.   Upper Extremity:  Right  Left  Deltoid  5/5   5/5   Biceps  5/5   5/5   Triceps  5/5   5/5   Infraspinatus 5/5  5/5  Medial pectoralis 5/5  5/5  Wrist extensors  5/5   5/5   Wrist flexors  5/5   5/5   Finger extensors  5/5   5/5   Finger flexors  5/5   5/5   Dorsal interossei  5/5   5/5   Abductor pollicis  5/5   5/5   Tone (Ashworth scale)  0  0   Lower Extremity:  Right  Left  Hip flexors  5/5   5/5   Hip extensors  5/5   5/5   Adductor 5/5  5/5  Abductor 5/5  5/5  Knee flexors  5/5   5/5   Knee extensors  5/5   5/5   Dorsiflexors  5/5   5/5   Plantarflexors  5/5   5/5   Toe extensors  5/5   5/5   Toe flexors  5/5   5/5   Tone (Ashworth scale)  0  0   MSRs:  Right        Left                  brachioradialis 2+  2+  biceps 2+  2+  triceps 2+  2+  patellar 2+  Not tested, pt request  ankle jerk 2+  2+  plantar response down  down   SENSORY:  Normal and symmetric perception of light touch, pinprick, vibration, and proprioception.  Romberg's sign absent.   COORDINATION/GAIT: Normal finger-to- nose-finger.  Intact rapid alternating movements bilaterally.  Gait narrow based and stable. Tandem and stressed gait intact.    IMPRESSION: Bilateral arm paresthesias, resolved.  Exam is normal. Possible entrapment neuropathy vs cervical radiculopathy  - I discussed that additional testing with NCS/EMG can be performed, however since symptoms are not currently bothersome, it was mutually decided to hold on testing  - If symptoms get worse, electrodiagnostic testing will be the next step.  2.  Right flank pain appears to be musculoskeletal.  No evidence of radiculopathy.  - Ice, heat, NSAIDs, topical ointment such as Biofreeze recommended - Follow-up with PCP   Thank you for allowing me to participate in patient's care.  If I can answer any additional questions, I would be pleased to do so.     Sincerely,    Maryalyce Sanjuan K. Allena Katz, DO

## 2021-02-20 HISTORY — PX: LITHOTRIPSY: SUR834

## 2021-02-28 IMAGING — MR MRI OF THE RIGHT KNEE WITHOUT CONTRAST
6 series · 38 of 40 positions shown · non-contrast
Comparison: None.

CLINICAL DATA: Intermittent anterior knee pain and swelling for the
past 3 months.

EXAM:
MRI OF THE RIGHT KNEE WITHOUT CONTRAST
TECHNIQUE: Multiplanar, multisequence MR imaging of the knee was performed. No
intravenous contrast was administered.

[Series 7: T2 fat-sat · axial · right · 4.0mm · 0.50mm/px · z∈[-59,+94]mm · 9 of 36 slices shown (1 of 3)]
[im 1/36]
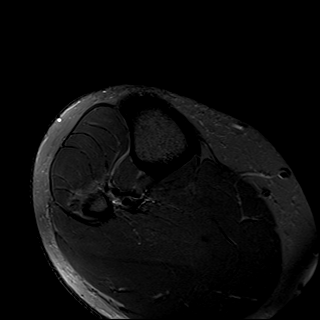
[im 5/36]
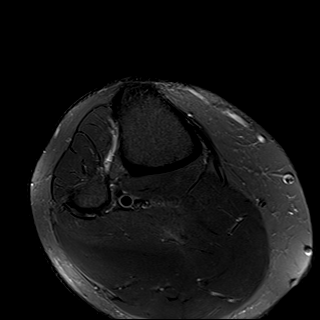
[im 9/36]
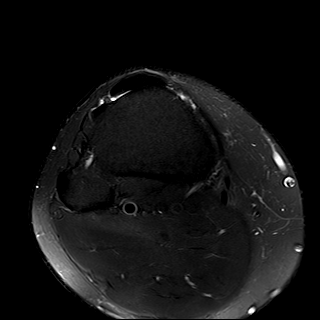
[im 14/36]
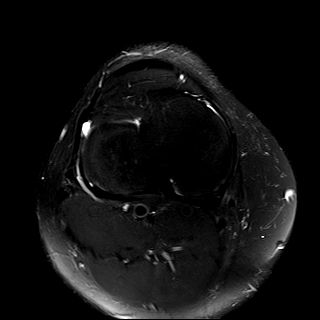
[im 18/36]
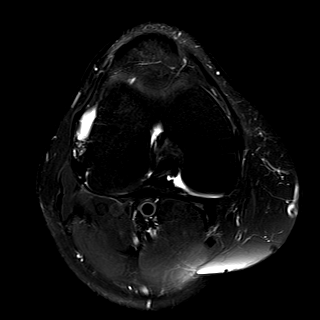
[im 22/36]
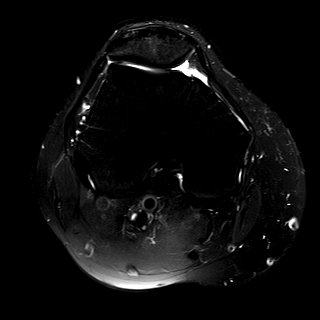
[im 27/36]
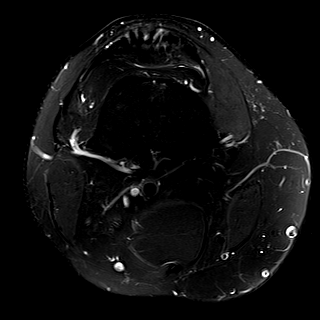
[im 31/36]
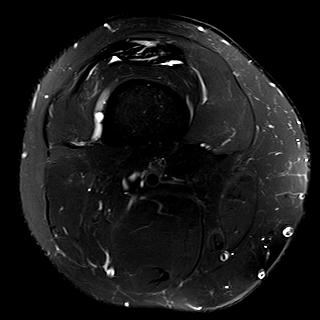
[im 36/36]
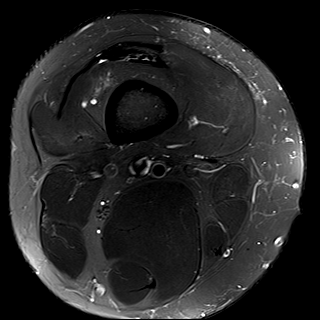

[Series 8: T2 fat-sat · coronal · right · 4.0mm · 0.42mm/px · 6 of 30 slices shown (2 of 3)]
[im 1/30]
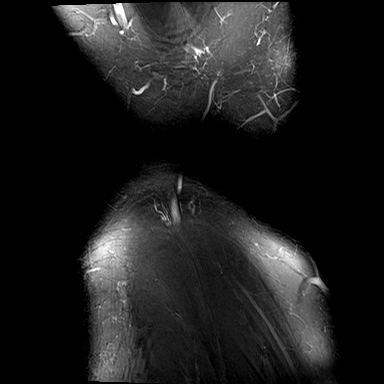
[im 6/30]
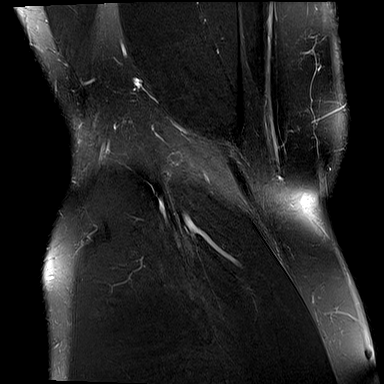
[im 12/30]
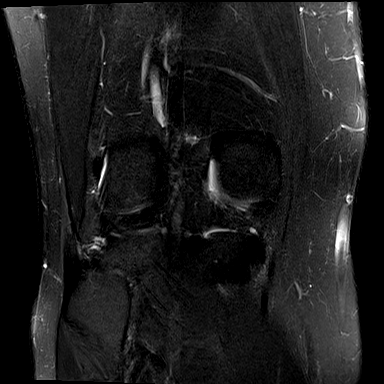
[im 18/30]
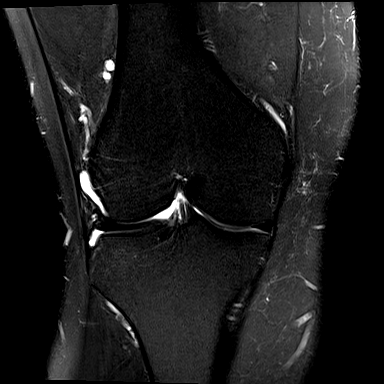
[im 24/30]
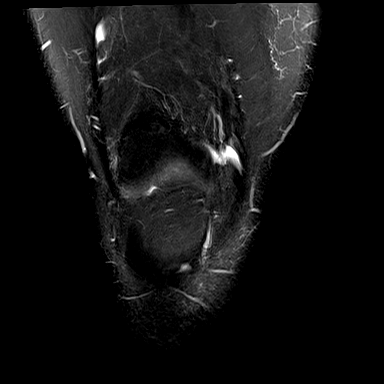
[im 30/30]
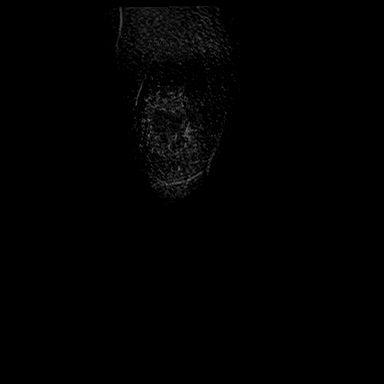

[Series 9: T1 · coronal · right · 4.0mm · 0.42mm/px · 4 of 30 slices shown]
[im 1/30]
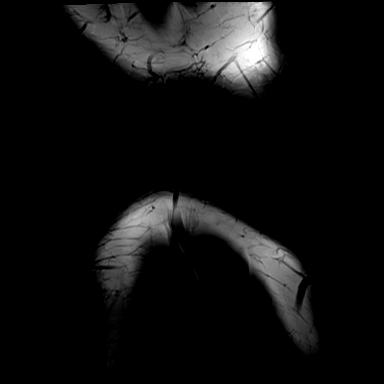
[im 6/30]
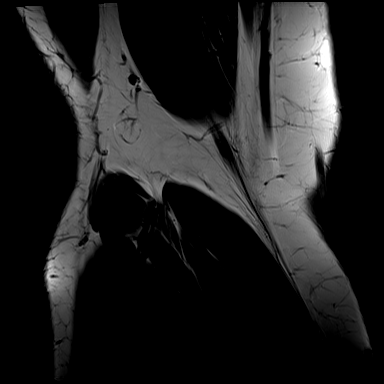
[im 12/30]
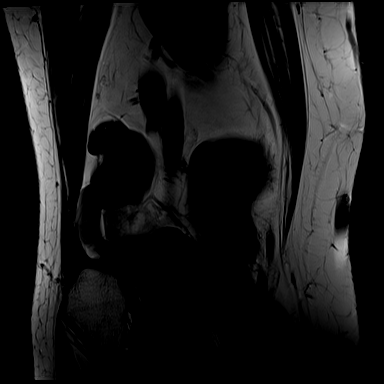
[im 18/30]
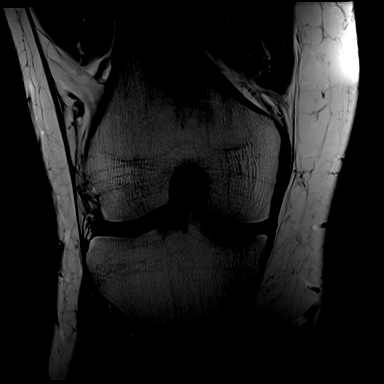

[Series 10: PD fat-sat · coronal · right · 3.0mm · 0.50mm/px · 7 of 32 slices shown (1 of 2)]
[im 1/32]
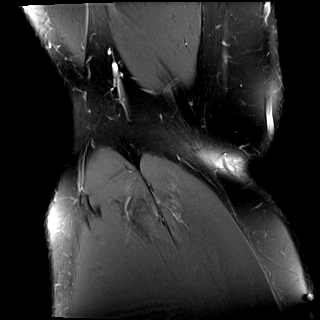
[im 6/32]
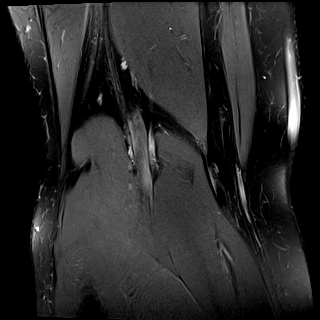
[im 11/32]
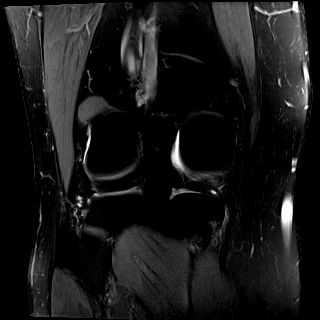
[im 16/32]
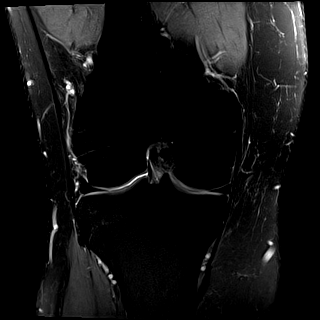
[im 21/32]
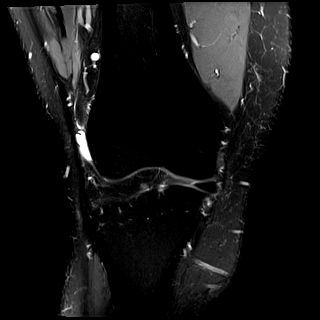
[im 26/32]
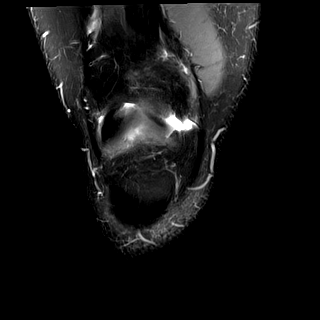
[im 32/32]
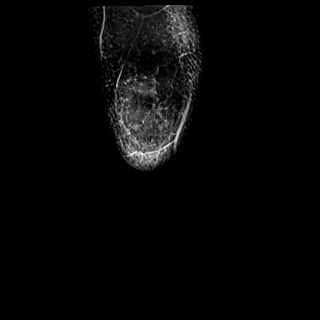

[Series 11: PD fat-sat · sagittal · right · 3.0mm · 0.42mm/px · 6 of 30 slices shown (2 of 2)]
[im 1/30]
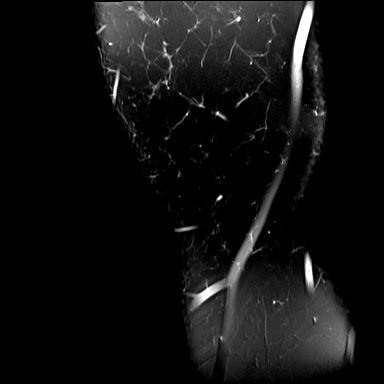
[im 6/30]
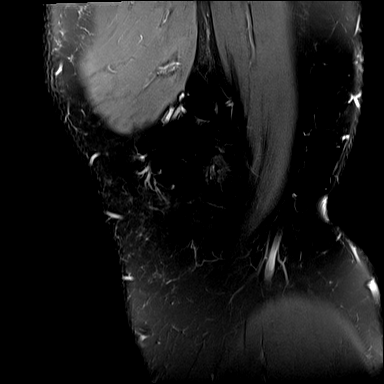
[im 12/30]
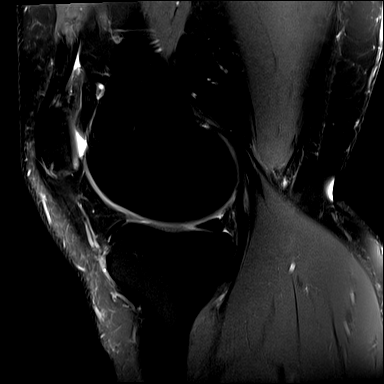
[im 18/30]
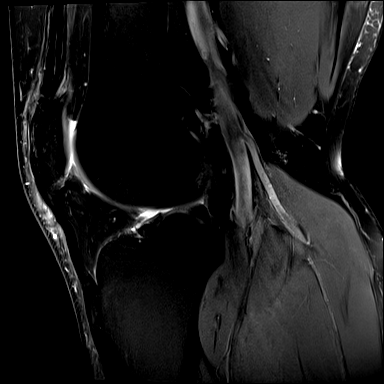
[im 24/30]
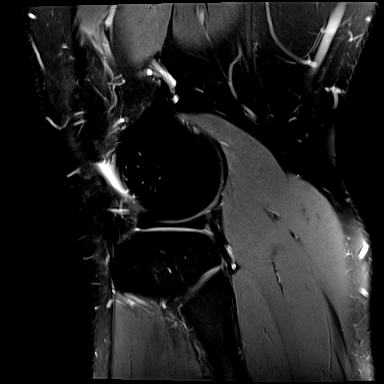
[im 30/30]
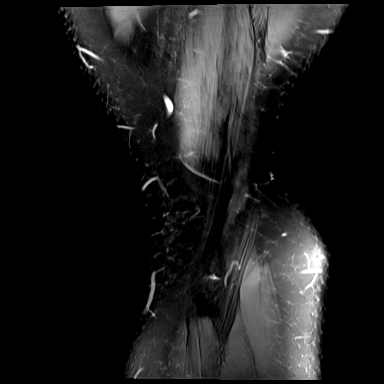

[Series 12: T2 fat-sat · sagittal · right · 3.0mm · 0.42mm/px · 6 of 30 slices shown (3 of 3)]
[im 1/30]
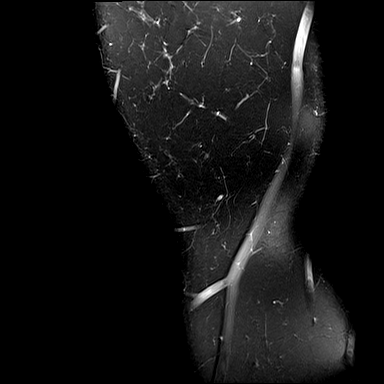
[im 6/30]
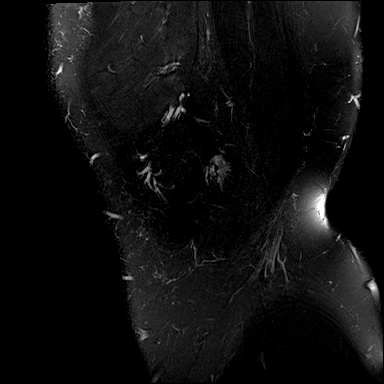
[im 12/30]
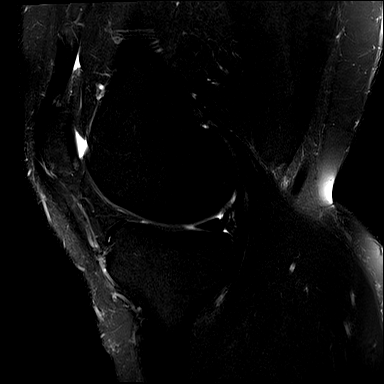
[im 18/30]
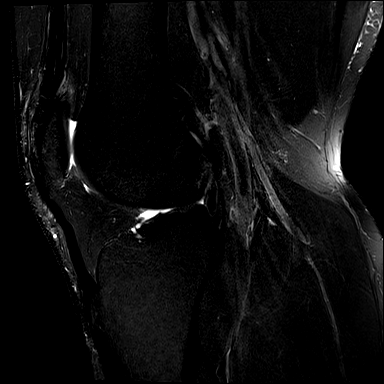
[im 24/30]
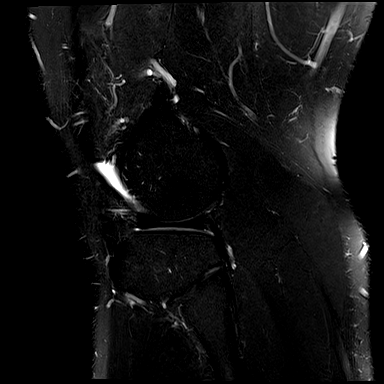
[im 30/30]
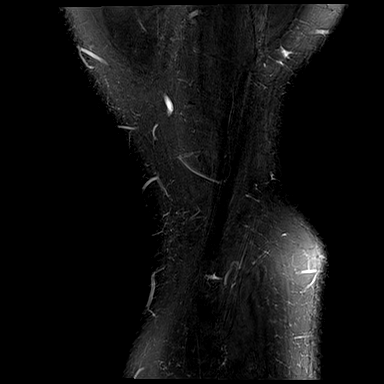

[38 of 40 positions shown; findings below may reference images not displayed]

FINDINGS: MENISCI

Medial meniscus:  Intact.

Lateral meniscus:  Intact.

LIGAMENTS

Cruciates:  Intact ACL and PCL.

Collaterals: Medial collateral ligament is intact. Lateral
collateral ligament complex is intact.

CARTILAGE

Patellofemoral:  No chondral defect.

Medial:  No chondral defect.

Lateral: Superficial fissuring and delamination over the central
lateral tibial plateau.

Joint: No joint effusion. Normal Hoffa's fat. No plical thickening.

Popliteal Fossa:  No Baker cyst. Intact popliteus tendon.

Extensor Mechanism: There is tendinosis and intrasubstance tearing
of the distal quadriceps tendon involving the rectus femoris and
vastus intermedius components. Intact patellar tendon with mild
proximal tendinosis. Intact medial and lateral patellar retinaculum.
Intact MPFL.

Bones:  No acute fracture or dislocation.  No focal bone lesion.

Other: None.
IMPRESSION: 1. Tendinosis and intrasubstance tearing of the distal quadriceps
tendon.
2. Mild proximal patellar tendinosis.
3. Early cartilage degeneration over the central lateral tibial
plateau.

## 2021-06-13 ENCOUNTER — Other Ambulatory Visit: Payer: Self-pay

## 2021-06-13 ENCOUNTER — Ambulatory Visit (HOSPITAL_BASED_OUTPATIENT_CLINIC_OR_DEPARTMENT_OTHER)
Admission: RE | Admit: 2021-06-13 | Discharge: 2021-06-13 | Disposition: A | Discharge status: Home / Self Care | Payer: BC Managed Care – PPO | Source: Ambulatory Visit | Attending: Orthopedic Surgery | Admitting: Orthopedic Surgery

## 2021-06-13 ENCOUNTER — Encounter (HOSPITAL_BASED_OUTPATIENT_CLINIC_OR_DEPARTMENT_OTHER)
Admission: RE | Disposition: A | Discharge status: Home / Self Care | Payer: Self-pay | Source: Ambulatory Visit | Attending: Orthopedic Surgery

## 2021-06-13 ENCOUNTER — Ambulatory Visit (HOSPITAL_BASED_OUTPATIENT_CLINIC_OR_DEPARTMENT_OTHER): Payer: BC Managed Care – PPO | Admitting: Certified Registered"

## 2021-06-13 ENCOUNTER — Encounter (HOSPITAL_BASED_OUTPATIENT_CLINIC_OR_DEPARTMENT_OTHER): Payer: Self-pay | Admitting: Orthopedic Surgery

## 2021-06-13 ENCOUNTER — Other Ambulatory Visit: Payer: Self-pay | Admitting: Orthopedic Surgery

## 2021-06-13 DIAGNOSIS — Z87891 Personal history of nicotine dependence: Secondary | ICD-10-CM | POA: Diagnosis not present

## 2021-06-13 DIAGNOSIS — Z6841 Body Mass Index (BMI) 40.0 and over, adult: Secondary | ICD-10-CM | POA: Insufficient documentation

## 2021-06-13 DIAGNOSIS — G4730 Sleep apnea, unspecified: Secondary | ICD-10-CM | POA: Insufficient documentation

## 2021-06-13 DIAGNOSIS — L02512 Cutaneous abscess of left hand: Secondary | ICD-10-CM | POA: Insufficient documentation

## 2021-06-13 DIAGNOSIS — E039 Hypothyroidism, unspecified: Secondary | ICD-10-CM | POA: Insufficient documentation

## 2021-06-13 DIAGNOSIS — W5501XA Bitten by cat, initial encounter: Secondary | ICD-10-CM | POA: Diagnosis not present

## 2021-06-13 DIAGNOSIS — R7303 Prediabetes: Secondary | ICD-10-CM | POA: Diagnosis not present

## 2021-06-13 DIAGNOSIS — F32A Depression, unspecified: Secondary | ICD-10-CM | POA: Diagnosis not present

## 2021-06-13 HISTORY — DX: Gout, unspecified: M10.9

## 2021-06-13 HISTORY — PX: INCISION AND DRAINAGE: SHX5863

## 2021-06-13 HISTORY — DX: Gastro-esophageal reflux disease without esophagitis: K21.9

## 2021-06-13 HISTORY — DX: Sleep apnea, unspecified: G47.30

## 2021-06-13 SURGERY — INCISION AND DRAINAGE
Anesthesia: General | Site: Finger | Laterality: Left

## 2021-06-13 MED ORDER — 0.9 % SODIUM CHLORIDE (POUR BTL) OPTIME
TOPICAL | Status: DC | PRN
  Administered 2021-06-13: 250 mL

## 2021-06-13 MED ORDER — ACETAMINOPHEN 500 MG PO TABS
1000.0000 mg | ORAL_TABLET | Freq: Once | ORAL | Status: AC
  Administered 2021-06-13: 1000 mg via ORAL

## 2021-06-13 MED ORDER — ONDANSETRON HCL 4 MG/2ML IJ SOLN
INTRAMUSCULAR | Status: DC | PRN
  Administered 2021-06-13: 4 mg via INTRAVENOUS

## 2021-06-13 MED ORDER — FENTANYL CITRATE (PF) 100 MCG/2ML IJ SOLN
INTRAMUSCULAR | Status: DC | PRN
  Administered 2021-06-13: 50 ug via INTRAVENOUS
  Administered 2021-06-13: 25 ug via INTRAVENOUS
  Administered 2021-06-13: 50 ug via INTRAVENOUS
  Administered 2021-06-13: 25 ug via INTRAVENOUS

## 2021-06-13 MED ORDER — BUPIVACAINE HCL (PF) 0.25 % IJ SOLN
INTRAMUSCULAR | Status: DC | PRN
  Administered 2021-06-13: 10 mL

## 2021-06-13 MED ORDER — MIDAZOLAM HCL 2 MG/2ML IJ SOLN
INTRAMUSCULAR | Status: AC
  Filled 2021-06-13: qty 2

## 2021-06-13 MED ORDER — DEXAMETHASONE SODIUM PHOSPHATE 4 MG/ML IJ SOLN
INTRAMUSCULAR | Status: DC | PRN
  Administered 2021-06-13: 4 mg via INTRAVENOUS

## 2021-06-13 MED ORDER — PROPOFOL 500 MG/50ML IV EMUL
INTRAVENOUS | Status: DC | PRN
  Administered 2021-06-13: 25 ug/kg/min via INTRAVENOUS

## 2021-06-13 MED ORDER — AMISULPRIDE (ANTIEMETIC) 5 MG/2ML IV SOLN
INTRAVENOUS | Status: AC
  Filled 2021-06-13: qty 4

## 2021-06-13 MED ORDER — BUPIVACAINE HCL (PF) 0.25 % IJ SOLN
INTRAMUSCULAR | Status: AC
  Filled 2021-06-13: qty 30

## 2021-06-13 MED ORDER — AMPICILLIN-SULBACTAM SODIUM 3 (2-1) G IJ SOLR
INTRAMUSCULAR | Status: AC
  Filled 2021-06-13: qty 8

## 2021-06-13 MED ORDER — ACETAMINOPHEN 500 MG PO TABS
ORAL_TABLET | ORAL | Status: AC
  Filled 2021-06-13: qty 2

## 2021-06-13 MED ORDER — PROPOFOL 10 MG/ML IV BOLUS
INTRAVENOUS | Status: AC
  Filled 2021-06-13: qty 20

## 2021-06-13 MED ORDER — TRAMADOL HCL 50 MG PO TABS
ORAL_TABLET | ORAL | 0 refills | Status: DC
Start: 2021-06-13 — End: 2022-06-04

## 2021-06-13 MED ORDER — OXYCODONE HCL 5 MG/5ML PO SOLN: 5.0000 mg | Freq: Once | ORAL | Status: DC | PRN

## 2021-06-13 MED ORDER — MIDAZOLAM HCL 5 MG/5ML IJ SOLN
INTRAMUSCULAR | Status: DC | PRN
Start: 2021-06-13 — End: 2021-06-13
  Administered 2021-06-13: 2 mg via INTRAVENOUS

## 2021-06-13 MED ORDER — SODIUM CHLORIDE 0.9 % IV SOLN
INTRAVENOUS | Status: DC | PRN
  Administered 2021-06-13: 3 g via INTRAVENOUS

## 2021-06-13 MED ORDER — FENTANYL CITRATE (PF) 100 MCG/2ML IJ SOLN
INTRAMUSCULAR | Status: AC
  Filled 2021-06-13: qty 2

## 2021-06-13 MED ORDER — LACTATED RINGERS IV SOLN: INTRAVENOUS | Status: DC

## 2021-06-13 MED ORDER — LIDOCAINE 2% (20 MG/ML) 5 ML SYRINGE
INTRAMUSCULAR | Status: DC | PRN
  Administered 2021-06-13: 60 mg via INTRAVENOUS

## 2021-06-13 MED ORDER — ONDANSETRON HCL 4 MG/2ML IJ SOLN: 4.0000 mg | Freq: Once | INTRAMUSCULAR | Status: DC | PRN

## 2021-06-13 MED ORDER — FENTANYL CITRATE (PF) 100 MCG/2ML IJ SOLN: 25.0000 ug | INTRAMUSCULAR | Status: DC | PRN

## 2021-06-13 MED ORDER — OXYCODONE HCL 5 MG PO TABS: 5.0000 mg | ORAL_TABLET | Freq: Once | ORAL | Status: DC | PRN

## 2021-06-13 MED ORDER — AMISULPRIDE (ANTIEMETIC) 5 MG/2ML IV SOLN
10.0000 mg | Freq: Once | INTRAVENOUS | Status: AC | PRN
  Administered 2021-06-13: 10 mg via INTRAVENOUS

## 2021-06-13 SURGICAL SUPPLY — 57 items
APL PRP STRL LF DISP 70% ISPRP (MISCELLANEOUS) ×1
BAG DECANTER FOR FLEXI CONT (MISCELLANEOUS) IMPLANT
BLADE MINI RND TIP GREEN BEAV (BLADE) IMPLANT
BLADE SURG 15 STRL LF DISP TIS (BLADE) ×2 IMPLANT
BLADE SURG 15 STRL SS (BLADE) ×4
BNDG CMPR 9X4 STRL LF SNTH (GAUZE/BANDAGES/DRESSINGS) ×1
BNDG COHESIVE 1X5 TAN STRL LF (GAUZE/BANDAGES/DRESSINGS) ×1 IMPLANT
BNDG ELASTIC 2X5.8 VLCR STR LF (GAUZE/BANDAGES/DRESSINGS) IMPLANT
BNDG ELASTIC 3X5.8 VLCR STR LF (GAUZE/BANDAGES/DRESSINGS) IMPLANT
BNDG ESMARK 4X9 LF (GAUZE/BANDAGES/DRESSINGS) ×1 IMPLANT
BNDG GAUZE 1X2.1 STRL (MISCELLANEOUS) IMPLANT
BNDG GAUZE ELAST 4 BULKY (GAUZE/BANDAGES/DRESSINGS) IMPLANT
CHLORAPREP W/TINT 26 (MISCELLANEOUS) ×2 IMPLANT
CORD BIPOLAR FORCEPS 12FT (ELECTRODE) ×2 IMPLANT
COVER BACK TABLE 60X90IN (DRAPES) ×2 IMPLANT
COVER MAYO STAND STRL (DRAPES) ×2 IMPLANT
CUFF TOURN SGL QUICK 18X4 (TOURNIQUET CUFF) ×1 IMPLANT
DRAPE EXTREMITY T 121X128X90 (DISPOSABLE) ×2 IMPLANT
DRAPE SURG 17X23 STRL (DRAPES) ×1 IMPLANT
DRAPE U-SHAPE 47X51 STRL (DRAPES) ×1 IMPLANT
GAUZE PACKING IODOFORM 1/4X15 (PACKING) ×1 IMPLANT
GAUZE SPONGE 4X4 12PLY STRL (GAUZE/BANDAGES/DRESSINGS) ×2 IMPLANT
GAUZE XEROFORM 1X8 LF (GAUZE/BANDAGES/DRESSINGS) ×1 IMPLANT
GLOVE SRG 8 PF TXTR STRL LF DI (GLOVE) ×1 IMPLANT
GLOVE SURG ENC MOIS LTX SZ7.5 (GLOVE) ×2 IMPLANT
GLOVE SURG POLYISO LF SZ6.5 (GLOVE) ×1 IMPLANT
GLOVE SURG UNDER POLY LF SZ6.5 (GLOVE) ×1 IMPLANT
GLOVE SURG UNDER POLY LF SZ7 (GLOVE) ×1 IMPLANT
GLOVE SURG UNDER POLY LF SZ8 (GLOVE) ×2
GOWN STRL REUS W/ TWL LRG LVL3 (GOWN DISPOSABLE) ×1 IMPLANT
GOWN STRL REUS W/TWL LRG LVL3 (GOWN DISPOSABLE) ×2
GOWN STRL REUS W/TWL XL LVL3 (GOWN DISPOSABLE) ×2 IMPLANT
LOOP VESSEL MAXI BLUE (MISCELLANEOUS) IMPLANT
NDL BLUNT 17GA (NEEDLE) IMPLANT
NDL HYPO 25X1 1.5 SAFETY (NEEDLE) IMPLANT
NEEDLE BLUNT 17GA (NEEDLE) IMPLANT
NEEDLE HYPO 25X1 1.5 SAFETY (NEEDLE) ×2 IMPLANT
NS IRRIG 1000ML POUR BTL (IV SOLUTION) ×2 IMPLANT
PACK BASIN DAY SURGERY FS (CUSTOM PROCEDURE TRAY) ×2 IMPLANT
PAD CAST 3X4 CTTN HI CHSV (CAST SUPPLIES) IMPLANT
PADDING CAST ABS 4INX4YD NS (CAST SUPPLIES)
PADDING CAST ABS COTTON 4X4 ST (CAST SUPPLIES) ×1 IMPLANT
PADDING CAST COTTON 3X4 STRL (CAST SUPPLIES)
SPLINT FINGER 3.25 911903 (SOFTGOODS) ×1 IMPLANT
SPLINT PLASTER CAST XFAST 3X15 (CAST SUPPLIES) IMPLANT
SPLINT PLASTER XTRA FASTSET 3X (CAST SUPPLIES)
STOCKINETTE 4X48 STRL (DRAPES) ×2 IMPLANT
SUT ETHILON 4 0 PS 2 18 (SUTURE) ×1 IMPLANT
SWAB COLLECTION DEVICE MRSA (MISCELLANEOUS) ×1 IMPLANT
SWAB CULTURE ESWAB REG 1ML (MISCELLANEOUS) ×1 IMPLANT
SYR 20ML LL LF (SYRINGE) IMPLANT
SYR BULB EAR ULCER 3OZ GRN STR (SYRINGE) ×2 IMPLANT
SYR CONTROL 10ML LL (SYRINGE) ×1 IMPLANT
SYR TOOMEY 50ML (SYRINGE) IMPLANT
TOWEL GREEN STERILE FF (TOWEL DISPOSABLE) ×4 IMPLANT
TUBE FEEDING ENTERAL 5FR 16IN (TUBING) IMPLANT
UNDERPAD 30X36 HEAVY ABSORB (UNDERPADS AND DIAPERS) ×1 IMPLANT

## 2021-06-13 NOTE — Op Note (Signed)
NAME: Marc Fisher ?MEDICAL RECORD NO: [id number] ?DATE OF BIRTH: Sep 05, 1975 ?FACILITY: [facility] ?LOCATION: [facility] ?PHYSICIAN: Tami Ribas, MD ?  ?OPERATIVE REPORT ?  ?DATE OF PROCEDURE: 06/13/21  ?  ?PREOPERATIVE DIAGNOSIS: Left long finger abscess from infected cat bite ?  ?POSTOPERATIVE DIAGNOSIS: Left long finger abscess x2 from infected cat bite ?  ?PROCEDURE: Left long finger incision and drainage abscess x2 via separate incisions ?  ?SURGEON:  Betha Loa, M.D. ?  ?ASSISTANT: none ?  ?ANESTHESIA:  General ?  ?INTRAVENOUS FLUIDS:  Per anesthesia flow sheet. ?  ?ESTIMATED BLOOD LOSS:  Minimal. ?  ?COMPLICATIONS:  None. ?  ?SPECIMENS: Cultures to micro ?  ?TOURNIQUET TIME:   ? ?Total Tourniquet Time Documented: ?Upper Arm (Left) - 15 minutes ?Total: Upper Arm (Left) - 15 minutes ? ?  ?DISPOSITION:  Stable to PACU. ?  ?INDICATIONS: 46 year old male states he was bitten on the left long finger by his cat 3 days ago.  He had increasing pain swelling and erythema of the finger.  He was seen at urgent care yesterday and started on antibiotics.  He followed up in the office today.  Recommended operative incision and drainage.  Risks, benefits and alternatives of surgery were discussed including the risks of blood loss, infection, damage to nerves, vessels, tendons, ligaments, bone for surgery, need for additional surgery, complications with wound healing, continued pain, stiffness, , need for repeat irrigation and debridement.  He voiced understanding of these risks and elected to proceed. ? ?OPERATIVE COURSE:  After being identified preoperatively by myself,  the patient and I agreed on the procedure and site of the procedure.  The surgical site was marked.  Surgical consent had been signed. Antibiotics were held for intraoperative cultures. He was transferred to the operating room and placed on the operating table in supine position with the Left upper extremity on an arm board.  General  anesthesia was induced by the anesthesiologist.  Left upper extremity was prepped and draped in normal sterile orthopedic fashion.  A surgical pause was performed between the surgeons, anesthesia, and operating room staff and all were in agreement as to the patient, procedure, and site of procedure.  Tourniquet at the proximal aspect of the extremity was inflated to 250 mmHg after exsanguination of the arm with an Esmarch bandage.  The 2 wounds at the pad of the finger were spread.  There was gross purulence.  Cultures were taken for aerobes and anaerobes.  The wound at the ulnar side of the finger was extended proximally distally with a knife.  The septae in the pad of the finger were spread with the scissors.  The wound at the tip of the finger was also extended laterally with a knife.  Scissors used to spread the subcutaneous tissues.  These 2 wounds communicated.  The wounds at the PIP joint were then dressed.  The wound on the ulnar side of the finger was spread.  There was gross purulence.  This appeared to be just into the subcutaneous tissues.  It did not appear to go deeper.  The wound at the dorsum of the finger just distal to the PIP joint was extended radially and ulnarly.  There again was gross purulence.  This wound was deeper but did not appear to go into the extensor tendon.  There was no swelling underneath the tendon or from the joint.  The wound was spread.  It tracked along the side of the finger toward the other  wound near the PIP joint.  The wounds were all copiously irrigated with sterile saline.  They were packed with quarter inch iodoform gauze.  Digital block was performed with quarter percent plain Marcaine to aid in postoperative analgesia.  The wounds were all dressed with sterile 4 x 4's and wrapped with a Coban dressing lightly.  An AlumaFoam splint was placed and wrapped lightly with Coban dressing.  He was given IV Unasyn after cultures have been taken.  A test dose was given first  and no reaction was noted.  The tourniquet was deflated at 15 minutes.  Fingertips were pink with brisk capillary refill after deflation of tourniquet.  The operative  drapes were broken down.  The patient was awoken from anesthesia safely.  He was transferred back to the stretcher and taken to PACU in stable condition.  I will see him back in the office in 3-4 days for postoperative followup.  I will give him a prescription for Tramadol 50 mg 1 tab PO q6 hours prn pain, dispense # 20.  He will continue on his already prescribed doxycycline and metronidazole. ? ? ?Betha Loa, MD ?Electronically signed, 06/13/21 ?

## 2021-06-13 NOTE — Anesthesia Preprocedure Evaluation (Addendum)
Anesthesia Evaluation  ?Patient identified by MRN, date of birth, ID band ?Patient awake ? ? ? ?Reviewed: ?Allergy & Precautions, NPO status , Patient's Chart, lab work & pertinent test results ? ?Airway ?Mallampati: II ? ?TM Distance: >3 FB ?Neck ROM: Full ? ? ? Dental ?no notable dental hx. ? ?  ?Pulmonary ?sleep apnea , former smoker,  ?  ?Pulmonary exam normal ?breath sounds clear to auscultation ? ? ? ? ? ? Cardiovascular ?negative cardio ROS ?Normal cardiovascular exam ?Rhythm:Regular Rate:Normal ? ? ?  ?Neuro/Psych ?PSYCHIATRIC DISORDERS Depression negative neurological ROS ?   ? GI/Hepatic ?negative GI ROS, Neg liver ROS,   ?Endo/Other  ?Hypothyroidism Morbid obesityprediabetes ? Renal/GU ?negative Renal ROS  ?negative genitourinary ?  ?Musculoskeletal ?negative musculoskeletal ROS ?(+)  ? Abdominal ?(+) + obese,   ?Peds ?negative pediatric ROS ?(+)  Hematology ?negative hematology ROS ?(+)   ?Anesthesia Other Findings ?Infected left long finger ? Reproductive/Obstetrics ?negative OB ROS ? ?  ? ? ? ? ? ? ? ? ? ? ? ? ? ?  ?  ? ? ? ? ? ? ? ?Anesthesia Physical ?Anesthesia Plan ? ?ASA: 3 and emergent ? ?Anesthesia Plan: General  ? ?Post-op Pain Management: Tylenol PO (pre-op)*  ? ?Induction: Intravenous ? ?PONV Risk Score and Plan: 2 and Treatment may vary due to age or medical condition, Midazolam, Ondansetron and Dexamethasone ? ?Airway Management Planned: LMA ? ?Additional Equipment: None ? ?Intra-op Plan:  ? ?Post-operative Plan: Extubation in OR ? ?Informed Consent: I have reviewed the patients History and Physical, chart, labs and discussed the procedure including the risks, benefits and alternatives for the proposed anesthesia with the patient or authorized representative who has indicated his/her understanding and acceptance.  ? ? ? ?Dental advisory given ? ?Plan Discussed with: CRNA, Anesthesiologist and Surgeon ? ?Anesthesia Plan Comments:   ? ? ? ? ? ?Anesthesia  Quick Evaluation ? ?

## 2021-06-13 NOTE — Anesthesia Postprocedure Evaluation (Signed)
Anesthesia Post Note ? ?Patient: Marc Fisher ? ?Procedure(s) Performed: INCISION AND DRAINAGE LEFT LONG FINGER (Left: Finger) ? ?  ? ?Patient location during evaluation: PACU ?Anesthesia Type: General ?Level of consciousness: awake ?Pain management: pain level controlled ?Vital Signs Assessment: post-procedure vital signs reviewed and stable ?Respiratory status: spontaneous breathing and respiratory function stable ?Cardiovascular status: stable ?Postop Assessment: no apparent nausea or vomiting ?Anesthetic complications: no ? ? ?No notable events documented. ? ?Last Vitals:  ?Vitals:  ? 06/13/21 1330 06/13/21 1345  ?BP: 120/75 115/76  ?Pulse: 69 66  ?Resp: 20 18  ?Temp:    ?SpO2: 97% 93%  ?  ?Last Pain:  ?Vitals:  ? 06/13/21 1345  ?TempSrc:   ?PainSc: 0-No pain  ? ? ?  ?  ?  ?  ?  ?  ? ?Mellody Dance ? ? ? ? ?

## 2021-06-13 NOTE — H&P (Signed)
Marc Fisher is an 46 y.o. male.   ?Chief Complaint: left hand cat bite ?HPI: 46 yo male states he was bitten on left long finger by his cat 3 days ago.  Seen at [facility] yesterday due to increasing pain, swelling, erythema.  Started on antibiotics.  Seen in office this morning.  He wishes to proceed with incision and drainage left long finger/hand. ? ?Allergies:  ?Allergies  ?Allergen Reactions  ? Penicillins   ?  hives  ? Sulfa Antibiotics   ?  Skin blisters and peels.  ? ? ?Past Medical History:  ?Diagnosis Date  ? Depression   ? GERD (gastroesophageal reflux disease)   ? Gout   ? Pilonidal cyst   ? Sleep apnea   ? Thyroid disease   ? ? ?Past Surgical History:  ?Procedure Laterality Date  ? CYST EXCISION    ? face  ? LITHOTRIPSY  02/20/2021  ? PILONIDAL CYST EXCISION    ? WISDOM TOOTH EXTRACTION    ? ? ?Family History: ?Family History  ?Problem Relation Age of Onset  ? Diabetes Mother   ? Diabetes Maternal Grandfather   ? Hyperlipidemia Paternal Grandfather   ? ? ?Social History:  ? reports that he quit smoking about 3 years ago. His smoking use included cigarettes. He has never used smokeless tobacco. He reports current alcohol use. He reports that he does not use drugs. ? ?Medications: ?Medications Prior to Admission  ?Medication Sig Dispense Refill  ? allopurinol (ZYLOPRIM) 100 MG tablet TAKE 1 TABLET(100 MG) BY MOUTH DAILY 90 tablet 0  ? clindamycin (CLEOCIN) 300 MG capsule Take by mouth.    ? doxycycline (VIBRAMYCIN) 100 MG capsule Take by mouth.    ? metronidazole (FLAGYL) 375 MG capsule Take 375 mg by mouth 2 (two) times daily.    ? pantoprazole (PROTONIX) 40 MG tablet Take 20 mg by mouth daily.    ? thyroid (NP THYROID) 120 MG tablet Take 120 mg by mouth daily before breakfast.    ? colchicine 0.6 MG tablet TAKE 1 TABLET(0.6 MG) BY MOUTH TWICE DAILY AS NEEDED (Patient not taking: Reported on 09/25/2020) 30 tablet 0  ? levothyroxine (SYNTHROID, LEVOTHROID) 112 MCG tablet TAKE 1 TABLET BY MOUTH DAILY BEFORE  BREAKFAST 90 tablet 0  ? ? ?No results found for this or any previous visit (from the past 48 hour(s)). ? ?No results found. ? ? ? ?Blood pressure 121/73, pulse 65, temperature 98 ?F (36.7 ?C), temperature source Oral, resp. rate 18, height 5\' 8"  (1.727 m), weight 127 kg, SpO2 98 %. ? ?General appearance: alert, cooperative, and appears stated age ?Head: Normocephalic, without obvious abnormality, atraumatic ?Neck: supple, symmetrical, trachea midline ?Extremities: Intact sensation and capillary refill all digits.  +epl/fpl/io.  Bite wounds on pad of distal phalanx.  Wounds near pip joint dorsally.  Swelling and erythema. ?Pulses: 2+ and symmetric ?Skin: Skin color, texture, turgor normal. No rashes or lesions ?Neurologic: Grossly normal ?Incision/Wound: as above ? ?Assessment/Plan ?Left long finger infected cat bites.  Recommend OR for incision and drainage.  Non operative and operative treatment options have been discussed with the patient and patient wishes to proceed with operative treatment. Risks, benefits, and alternatives of surgery have been discussed and the patient agrees with the plan of care.  ? ? ?06/13/2021, 12:19 PM ? ? ?

## 2021-06-13 NOTE — Anesthesia Procedure Notes (Signed)
Procedure Name: LMA Insertion ?Date/Time: 06/13/2021 12:56 PM ?Performed by: Sheryn Bison, CRNA ?Pre-anesthesia Checklist: Patient identified, Emergency Drugs available, Suction available and Patient being monitored ?Patient Re-evaluated:Patient Re-evaluated prior to induction ?Oxygen Delivery Method: Circle System Utilized ?Preoxygenation: Pre-oxygenation with 100% oxygen ?Induction Type: IV induction ?Ventilation: Mask ventilation without difficulty ?LMA: LMA inserted ?LMA Size: 4.0 ?Number of attempts: 1 ?Airway Equipment and Method: bite block ?Placement Confirmation: positive ETCO2 ?Tube secured with: Tape ?Dental Injury: Teeth and Oropharynx as per pre-operative assessment  ? ? ? ? ?

## 2021-06-13 NOTE — Transfer of Care (Signed)
Immediate Anesthesia Transfer of Care Note ? ?Patient: Marc Fisher ? ?Procedure(s) Performed: INCISION AND DRAINAGE LEFT LONG FINGER (Left: Finger) ? ?Patient Location: PACU ? ?Anesthesia Type:General ? ?Level of Consciousness: drowsy ? ?Airway & Oxygen Therapy: Patient Spontanous Breathing and Patient connected to face mask oxygen ? ?Post-op Assessment: Report given to RN and Post -op Vital signs reviewed and stable ? ?Post vital signs: Reviewed and stable ? ?Last Vitals:  ?Vitals Value Taken Time  ?BP    ?Temp    ?Pulse 80 06/13/21 1319  ?Resp 22 06/13/21 1319  ?SpO2 91 % 06/13/21 1319  ?Vitals shown include unvalidated device data. ? ?Last Pain:  ?Vitals:  ? 06/13/21 1051  ?TempSrc: Oral  ?PainSc: 8   ?   ? ?Patients Stated Pain Goal: 5 (06/13/21 1051) ? ?Complications: No notable events documented. ?

## 2021-06-13 NOTE — Discharge Instructions (Addendum)
[facility] Instructions ?Hand Surgery ? ?Wound Care: ?Keep your hand elevated above the level of your heart.  Do not allow it to dangle by your side.  Keep the dressing dry and do not remove it unless your doctor advises you to do so.  He will usually change it at the time of your post-op visit.  Moving your fingers is advised to stimulate circulation but will depend on the site of your surgery.  If you have a splint applied, your doctor will advise you regarding movement. ? ?Activity: ?Do not drive or operate machinery today.  Rest today and then you may return to your normal activity and work as indicated by your physician. ? ?Diet:  ?Drink liquids today or eat a light diet.  You may resume a regular diet tomorrow.   ? ?General expectations: ?Pain for two to three days. ?Fingers may become slightly swollen. ? ?Call your doctor if any of the following occur: ?Severe pain not relieved by pain medication. ?Elevated temperature. ?Dressing soaked with blood. ?Inability to move fingers. ?White or bluish color to fingers. ? ? ?No Tylenol before 5:00pm if needed. ? ?Post Anesthesia Home Care Instructions ? ?Activity: ?Get plenty of rest for the remainder of the day. A responsible individual must stay with you for 24 hours following the procedure.  ?For the next 24 hours, DO NOT: ?-Drive a car ?-Paediatric nurse ?-Drink alcoholic beverages ?-Take any medication unless instructed by your physician ?-Make any legal decisions or sign important papers. ? ?Meals: ?Start with liquid foods such as gelatin or soup. Progress to regular foods as tolerated. Avoid greasy, spicy, heavy foods. If nausea and/or vomiting occur, drink only clear liquids until the nausea and/or vomiting subsides. Call your physician if vomiting continues. ? ?Special Instructions/Symptoms: ?Your throat may feel dry or sore from the anesthesia or the breathing tube placed in your throat during surgery. If this causes discomfort, gargle with warm salt water.  The discomfort should disappear within 24 hours. ? ?If you had a scopolamine patch placed behind your ear for the management of post- operative nausea and/or vomiting: ? ?1. The medication in the patch is effective for 72 hours, after which it should be removed.  Wrap patch in a tissue and discard in the trash. Wash hands thoroughly with soap and water. ?2. You may remove the patch earlier than 72 hours if you experience unpleasant side effects which may include dry mouth, dizziness or visual disturbances. ?3. Avoid touching the patch. Wash your hands with soap and water after contact with the patch. ?    ? ?

## 2021-06-14 ENCOUNTER — Encounter (HOSPITAL_BASED_OUTPATIENT_CLINIC_OR_DEPARTMENT_OTHER): Payer: Self-pay | Admitting: Orthopedic Surgery

## 2021-06-14 LAB — ACID FAST SMEAR (AFB, MYCOBACTERIA): Acid Fast Smear: NEGATIVE

## 2021-06-19 LAB — AEROBIC/ANAEROBIC CULTURE W GRAM STAIN (SURGICAL/DEEP WOUND)
Culture: NORMAL
Gram Stain: NONE SEEN

## 2021-07-27 LAB — ACID FAST CULTURE WITH REFLEXED SENSITIVITIES (MYCOBACTERIA): Acid Fast Culture: NEGATIVE

## 2022-02-05 LAB — HM DIABETES EYE EXAM

## 2022-05-01 ENCOUNTER — Encounter (HOSPITAL_COMMUNITY): Payer: Self-pay

## 2022-05-01 ENCOUNTER — Ambulatory Visit (HOSPITAL_COMMUNITY)
Admission: EM | Admit: 2022-05-01 | Discharge: 2022-05-01 | Disposition: A | Discharge status: Home / Self Care | Payer: BC Managed Care – PPO | Attending: Internal Medicine | Admitting: Internal Medicine

## 2022-05-01 DIAGNOSIS — R0789 Other chest pain: Secondary | ICD-10-CM | POA: Diagnosis not present

## 2022-05-01 NOTE — ED Provider Notes (Signed)
[place]   [id number] 05/01/22 Arrival Time: 1232  ASSESSMENT & PLAN:  1. Chest wall pain    -Patient presents to the urgent care with few hours of left-sided chest pain.  The pain does not radiate.  The pain is reproducible with palpation of the left chest wall.  He is comfortable and well-appearing.  His EKG shows no ST elevations or T wave inversions.  There are no current clinical signs of ACS.  Reassurance was provided to the patient and we discussed symptomatic treatment for chest wall pain including ice and Tylenol as needed..  I did recommend close observation over the next several hours and if his chest pain is to worsen, become diaphoretic, or shortness of breath or difficulty breathing he should present to the ER where he should undergo a chest pain rule out.  He voiced understanding and agreed to plan.  No orders of the defined types were placed in this encounter.    Discharge Instructions      Your EKG did not show any signs of a heart attack today Please be mindful of your chest pain and go to the ER if it worsens, you become really sweaty, or have difficulty breathing I have the info for Dr. Bartholomew Crews at [facility] below  It was nice to meet you today!      Follow-up Information     Dameron, Luna Fuse, DO.   Specialty: Family Medicine Why: To establish care Contact information: [place] [address] [phone]                  Reviewed expectations re: course of current medical issues. Questions answered. Outlined signs and symptoms indicating need for more acute intervention. Patient verbalized understanding. After Visit Summary given.   SUBJECTIVE:  Pleasant 47 year old male comes urgent care to be evaluated for left-sided chest pain.  He says his chest started hurting when he woke up this morning.  He took Tylenol but that did not help.  Describes as a sharp shooting pain over the superior and  lateral aspect of the chest wall.  He is point tender in 1 area close to the nipple.  Not have any inciting injury or trauma.  However, he does report that he had several aggressive sneezes last night.  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  He is found to have elevated blood pressure 156/90 at school.  He has no history of hypertension.  He was told by the school nurse to come here to be evaluated.  He is no longer sweaty.  He is sitting in the room comfortably.  He has no history of cardiac problems in the past.  His primary care monitors his cholesterol which was within normal limits last year.  He does have history of prediabetes.  Former smoker.   No LMP for male patient. Past Surgical History:  Procedure Laterality Date   CYST EXCISION     face   INCISION AND DRAINAGE Left 06/13/2021   Procedure: INCISION AND DRAINAGE LEFT LONG FINGER;  Surgeon: Leanora Cover, MD;  Location: [place];  Service: Orthopedics;  Laterality: Left;   LITHOTRIPSY  02/20/2021   PILONIDAL CYST EXCISION     WISDOM TOOTH EXTRACTION       OBJECTIVE:  Vitals:   05/01/22 1247  BP: Marland Kitchen)  146/92  Pulse: 70  Resp: 18  Temp: 98 F (36.7 C)  TempSrc: Oral  SpO2: 95%     Physical Exam Vitals and nursing note reviewed.  Constitutional:      General: He is not in acute distress.    Appearance: He is not ill-appearing, toxic-appearing or diaphoretic.  HENT:     Head: Normocephalic.  Cardiovascular:     Rate and Rhythm: Normal rate and regular rhythm.     Heart sounds: Normal heart sounds.  Pulmonary:     Effort: Pulmonary effort is normal.     Breath sounds: Normal breath sounds. No wheezing.  Chest:     Chest wall: Tenderness (left upper chest wall tender to palpation) present.  Abdominal:     Palpations: Abdomen is soft.  Musculoskeletal:        General: Normal range of  motion.     Cervical back: Normal range of motion.  Neurological:     General: No focal deficit present.     Mental Status: He is alert.  Psychiatric:        Mood and Affect: Mood normal.    EKG Interpretation: Normal sinus rhythm.  Rate 67.  No ST elevations.  No T wave inversions. Impression performed by myself: Unremarkable EKG  Labs: Results for orders placed or performed during the hospital encounter of 06/13/21  Aerobic/Anaerobic Culture w Gram Stain (surgical/deep wound)   Specimen: PATH Other; Tissue  Result Value Ref Range   Specimen Description WOUND    Special Requests LEFT LONG FINGER WOUND    Gram Stain NO WBC SEEN NO ORGANISMS SEEN     Culture      NORMAL SKIN FLORA NO ANAEROBES ISOLATED Performed at [facility], [place] [address], [place], [place] [address]    Report Status 06/19/2021 FINAL   Acid Fast Culture with reflexed sensitivities   Specimen: PATH Other; Tissue  Result Value Ref Range   Acid Fast Culture Negative    Source of Sample WOUND   Acid Fast Smear (AFB)   Specimen: PATH Other; Tissue  Result Value Ref Range   AFB Specimen Processing Concentration    Acid Fast Smear Negative    Source (AFB) LEFT LONG FINGER WOUND CULTURES    Labs Reviewed - No data to display  Imaging: No results found.   Allergies  Allergen Reactions   Penicillins     hives   Sulfa Antibiotics     Skin blisters and peels.                                               Past Medical History:  Diagnosis Date   Depression    GERD (gastroesophageal reflux disease)    Gout    Pilonidal cyst    Sleep apnea    Thyroid disease     Social History   Socioeconomic History   Marital status: Married    Spouse name: Not on file   Number of children: Not on file   Years of education: Not on file   Highest education level: Not on file  Occupational History   Not on file  Tobacco Use   Smoking status: Former    Types: Cigarettes    Quit date: 2020    Years  since quitting: 4.1   Smokeless tobacco: Never  Vaping Use  Vaping Use: Never used  Substance and Sexual Activity   Alcohol use: Yes    Comment: 2 per week   Drug use: No   Sexual activity: Not on file  Other Topics Concern   Not on file  Social History Narrative   Left Handed    Lives in a two story home    Social Determinants of Health   Financial Resource Strain: Not on file  Food Insecurity: Not on file  Transportation Needs: Not on file  Physical Activity: Not on file  Stress: Not on file  Social Connections: Not on file  Intimate Partner Violence: Not on file    Family History  Problem Relation Age of Onset   Diabetes Mother    Diabetes Maternal Grandfather    Hyperlipidemia Paternal Grandfather       Bernadette Gores, Dorian Pod, MD 05/01/22 1346

## 2022-05-01 NOTE — ED Triage Notes (Signed)
Pt states he woke up this morning feeling mild chest pain. Continued to work [employer]) thinking it would spontaneously resolve. States it did not and was seen by school nurse with elevated BP. A few hours later it was getting worse and pt told the school nurse that it is hard to take a deep breath. At that point the nurse recommended pt seek care. Currently experiencing CP and says it hurts to "breathe deep" but stops short of acknowledging SOB. Pain involves left shoulder as well.

## 2022-05-01 NOTE — Discharge Instructions (Addendum)
Your EKG did not show any signs of a heart attack today Please be mindful of your chest pain and go to the ER if it worsens, you become really sweaty, or have difficulty breathing I have the info for Dr. Bartholomew Crews at [facility] below  It was nice to meet you today!

## 2022-05-18 ENCOUNTER — Emergency Department (HOSPITAL_BASED_OUTPATIENT_CLINIC_OR_DEPARTMENT_OTHER)
Admission: EM | Admit: 2022-05-18 | Discharge: 2022-05-18 | Disposition: A | Discharge status: Home / Self Care | Payer: BC Managed Care – PPO | Attending: Emergency Medicine | Admitting: Emergency Medicine

## 2022-05-18 ENCOUNTER — Encounter (HOSPITAL_BASED_OUTPATIENT_CLINIC_OR_DEPARTMENT_OTHER): Payer: Self-pay | Admitting: Emergency Medicine

## 2022-05-18 ENCOUNTER — Other Ambulatory Visit: Payer: Self-pay

## 2022-05-18 ENCOUNTER — Emergency Department (HOSPITAL_BASED_OUTPATIENT_CLINIC_OR_DEPARTMENT_OTHER): Payer: BC Managed Care – PPO

## 2022-05-18 DIAGNOSIS — Y92410 Unspecified street and highway as the place of occurrence of the external cause: Secondary | ICD-10-CM | POA: Diagnosis not present

## 2022-05-18 DIAGNOSIS — S161XXA Strain of muscle, fascia and tendon at neck level, initial encounter: Secondary | ICD-10-CM | POA: Diagnosis not present

## 2022-05-18 DIAGNOSIS — M545 Low back pain, unspecified: Secondary | ICD-10-CM | POA: Insufficient documentation

## 2022-05-18 DIAGNOSIS — R519 Headache, unspecified: Secondary | ICD-10-CM | POA: Insufficient documentation

## 2022-05-18 DIAGNOSIS — M542 Cervicalgia: Secondary | ICD-10-CM | POA: Diagnosis present

## 2022-05-18 MED ORDER — CYCLOBENZAPRINE HCL 10 MG PO TABS: 10.0000 mg | ORAL_TABLET | Freq: Two times a day (BID) | ORAL | 0 refills | Status: DC | PRN

## 2022-05-18 MED ORDER — IBUPROFEN 800 MG PO TABS
800.0000 mg | ORAL_TABLET | Freq: Once | ORAL | Status: AC
  Administered 2022-05-18: 800 mg via ORAL
  Filled 2022-05-18: qty 1
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
## 2022-06-04 ENCOUNTER — Ambulatory Visit: Payer: BC Managed Care – PPO | Admitting: Student

## 2022-06-04 ENCOUNTER — Encounter: Payer: Self-pay | Admitting: Student

## 2022-06-04 VITALS — BP 102/58 | HR 72 | Ht 68.0 in | Wt 286.6 lb

## 2022-06-04 DIAGNOSIS — R7303 Prediabetes: Secondary | ICD-10-CM

## 2022-06-04 DIAGNOSIS — Z7689 Persons encountering health services in other specified circumstances: Secondary | ICD-10-CM

## 2022-06-04 NOTE — Patient Instructions (Addendum)
It was so great to meet you! Thank you for allowing me to participate in your care!  We will check some blood work today. I will call you with the results and we will schedule your next visit.  Dr. [name], [place]
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
## 2022-06-05 ENCOUNTER — Encounter: Payer: Self-pay | Admitting: Student

## 2022-06-05 LAB — CBC
Hematocrit: 45.9 % (ref 37.5–51.0)
Hemoglobin: 15.4 g/dL (ref 13.0–17.7)
MCH: 27.1 pg (ref 26.6–33.0)
MCHC: 33.6 g/dL (ref 31.5–35.7)
MCV: 81 fL (ref 79–97)
Platelets: 247 10*3/uL (ref 150–450)
RBC: 5.69 x10E6/uL (ref 4.14–5.80)
RDW: 14.3 % (ref 11.6–15.4)
WBC: 8.4 10*3/uL (ref 3.4–10.8)

## 2022-06-05 LAB — COMPREHENSIVE METABOLIC PANEL
ALT: 28 IU/L (ref 0–44)
AST: 17 IU/L (ref 0–40)
Albumin/Globulin Ratio: 1.8 (ref 1.2–2.2)
Albumin: 4.6 g/dL (ref 4.1–5.1)
Alkaline Phosphatase: 85 IU/L (ref 44–121)
BUN/Creatinine Ratio: 18 (ref 9–20)
BUN: 25 mg/dL — ABNORMAL HIGH (ref 6–24)
Bilirubin Total: 0.3 mg/dL (ref 0.0–1.2)
CO2: 21 mmol/L (ref 20–29)
Calcium: 9.9 mg/dL (ref 8.7–10.2)
Chloride: 99 mmol/L (ref 96–106)
Creatinine, Ser: 1.37 mg/dL — ABNORMAL HIGH (ref 0.76–1.27)
Globulin, Total: 2.6 g/dL (ref 1.5–4.5)
Glucose: 180 mg/dL — ABNORMAL HIGH (ref 70–99)
Potassium: 4.6 mmol/L (ref 3.5–5.2)
Sodium: 141 mmol/L (ref 134–144)
Total Protein: 7.2 g/dL (ref 6.0–8.5)
eGFR: 65 mL/min/{1.73_m2} (ref >59)

## 2022-06-05 LAB — HEMOGLOBIN A1C
Est. average glucose Bld gHb Est-mCnc: 134 mg/dL
Hgb A1c MFr Bld: 6.3 % — ABNORMAL HIGH (ref 4.8–5.6)

## 2022-06-05 LAB — LIPID PANEL
Chol/HDL Ratio: 2.6 ratio (ref 0.0–5.0)
Cholesterol, Total: 141 mg/dL (ref 100–199)
HDL: 54 mg/dL (ref >39)
LDL Chol Calc (NIH): 72 mg/dL (ref 0–99)
Triglycerides: 75 mg/dL (ref 0–149)
VLDL Cholesterol Cal: 15 mg/dL (ref 5–40)

## 2022-06-05 LAB — URIC ACID: Uric Acid: 5.4 mg/dL (ref 3.8–8.4)

## 2022-06-05 LAB — TESTOSTERONE: Testosterone: 334 ng/dL (ref 264–916)

## 2022-07-22 ENCOUNTER — Other Ambulatory Visit: Payer: Self-pay | Admitting: Student

## 2022-07-23 ENCOUNTER — Other Ambulatory Visit (HOSPITAL_COMMUNITY): Payer: Self-pay

## 2022-07-23 ENCOUNTER — Other Ambulatory Visit: Payer: Self-pay

## 2022-07-23 MED ORDER — THYROID 120 MG PO TABS
120.0000 mg | ORAL_TABLET | Freq: Every day | ORAL | 3 refills | Status: DC
  Filled 2022-07-23: qty 90, 90d supply, fill #0

## 2022-07-24 ENCOUNTER — Other Ambulatory Visit: Payer: Self-pay

## 2022-07-25 ENCOUNTER — Other Ambulatory Visit: Payer: Self-pay

## 2022-07-25 ENCOUNTER — Encounter: Payer: Self-pay | Admitting: Pharmacist

## 2022-07-29 ENCOUNTER — Other Ambulatory Visit: Payer: Self-pay

## 2022-08-14 ENCOUNTER — Other Ambulatory Visit (HOSPITAL_COMMUNITY): Payer: Self-pay

## 2022-08-17 ENCOUNTER — Other Ambulatory Visit (HOSPITAL_COMMUNITY): Payer: Self-pay

## 2022-11-15 ENCOUNTER — Ambulatory Visit: Payer: BC Managed Care – PPO | Admitting: Family Medicine

## 2022-11-15 VITALS — BP 114/77 | HR 78 | Ht 68.0 in | Wt 287.4 lb

## 2022-11-15 DIAGNOSIS — S39012A Strain of muscle, fascia and tendon of lower back, initial encounter: Secondary | ICD-10-CM | POA: Insufficient documentation

## 2022-11-15 DIAGNOSIS — M545 Low back pain, unspecified: Secondary | ICD-10-CM | POA: Insufficient documentation

## 2022-11-15 DIAGNOSIS — G8929 Other chronic pain: Secondary | ICD-10-CM | POA: Diagnosis not present

## 2022-11-15 HISTORY — DX: Strain of muscle, fascia and tendon of lower back, initial encounter: S39.012A

## 2022-11-15 MED ORDER — DICLOFENAC SODIUM 1 % EX GEL: 2.0000 g | Freq: Four times a day (QID) | CUTANEOUS | Status: DC
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
## 2022-11-15 NOTE — Patient Instructions (Addendum)
It was wonderful to see you today!  Today we discussed your back pain. I have referred you to physical therapy. Once our referral coordinator has an appointment for you, you will receive a call with all the details. In the meantime, please use the voltarin gel I prescribed for you and heat to help loosen the muscles and reduce your pain. You can also go through some of the stretches we practiced first thing in the morning to improve your mobility.   I have also ordered an Xray of your lumbar spine. You can take the order sheet to [facility] Radiology department at any time to have this done. Please try and have the images taken before your next appointment.   I will see you for follow up on September 17th at 4pm.   Please call [phone] with any questions about today's appointment.   If you need any additional refills, please call your pharmacy before calling the office.  Gerrit Heck, DO Family Medicine

## 2022-11-15 NOTE — Assessment & Plan Note (Signed)
>>  ASSESSMENT AND PLAN FOR LOW BACK PAIN WRITTEN ON 11/15/2022  5:18 PM BY Romone Shaff, DO  After discussing with patient and his partner, I prescribed him voltaren gel for short term symptomatic relief and encouraged him to continue using heat therapy as tolerated. I have also ordered an xray to rule out any occult fractures due to the mechanism of injury and duration of symptoms. PT referral has been placed, and patient will follow up with me in three weeks.

## 2022-11-15 NOTE — Assessment & Plan Note (Signed)
After discussing with patient and his partner, I prescribed him voltaren gel for short term symptomatic relief and encouraged him to continue using heat therapy as tolerated. I have also ordered an xray to rule out any occult fractures due to the mechanism of injury and duration of symptoms. PT referral has been placed, and patient will follow up with me in three weeks.

## 2022-11-19 ENCOUNTER — Encounter: Payer: Self-pay | Admitting: Student

## 2022-11-20 MED ORDER — ALLOPURINOL 100 MG PO TABS: 100.0000 mg | ORAL_TABLET | Freq: Every day | ORAL | 3 refills | Status: DC

## 2022-11-24 ENCOUNTER — Ambulatory Visit (HOSPITAL_COMMUNITY)
Admit: 2022-11-24 | Discharge: 2022-11-24 | Disposition: A | Discharge status: Home / Self Care | Payer: BC Managed Care – PPO | Source: Ambulatory Visit | Attending: Family Medicine | Admitting: Family Medicine

## 2022-11-24 DIAGNOSIS — M545 Low back pain, unspecified: Secondary | ICD-10-CM | POA: Insufficient documentation

## 2022-11-24 DIAGNOSIS — G8929 Other chronic pain: Secondary | ICD-10-CM | POA: Diagnosis present
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
## 2022-12-02 ENCOUNTER — Ambulatory Visit: Payer: BC Managed Care – PPO | Attending: Family Medicine | Admitting: Physical Therapy

## 2022-12-02 ENCOUNTER — Other Ambulatory Visit: Payer: Self-pay

## 2022-12-02 ENCOUNTER — Encounter: Payer: Self-pay | Admitting: Family Medicine

## 2022-12-02 DIAGNOSIS — M62830 Muscle spasm of back: Secondary | ICD-10-CM | POA: Diagnosis present

## 2022-12-02 DIAGNOSIS — G8929 Other chronic pain: Secondary | ICD-10-CM | POA: Diagnosis not present

## 2022-12-02 DIAGNOSIS — R2689 Other abnormalities of gait and mobility: Secondary | ICD-10-CM | POA: Insufficient documentation

## 2022-12-02 DIAGNOSIS — M5416 Radiculopathy, lumbar region: Secondary | ICD-10-CM | POA: Insufficient documentation

## 2022-12-02 DIAGNOSIS — M545 Low back pain, unspecified: Secondary | ICD-10-CM | POA: Insufficient documentation

## 2022-12-02 DIAGNOSIS — M5459 Other low back pain: Secondary | ICD-10-CM | POA: Insufficient documentation

## 2022-12-02 NOTE — Telephone Encounter (Signed)
Called patient in response to [employer].   Reports severe muscle spasms that will cause him to have difficulty with walking. He also reports that he will have issues getting up and down from chair due to pain.   Reports that this morning he experienced a sensation described as, "Rolling pin across nerve in back" which lasted about 4 seconds. He is walking with a cane right now. States that if he does not have support when this hits, he would fall down.   Patient reports taking 100 mg gabapentin last night and this morning to help with pain.   States that he is functioning, currently at school.  Sporadic 2 weeks ago, now pain has become more constant.   No bowel or bladder incontinence.   Tingling in legs and arms. Has been like this for the last 1.5 weeks.   He is asking about additional imaging for further evaluation and possible prescription of gabapentin to help with symptoms. He does have follow up appointment tomorrow with Dr. Hyacinth Meeker. He is also asking if he should proceed with appointment for PT this afternoon. Advised that I would send message to Dr. Hyacinth Meeker and let him know how to proceed until tomorrow's appointment.   Veronda Prude, RN

## 2022-12-02 NOTE — Telephone Encounter (Signed)
Called patient and after confirming his name and date of birth, asked about red flag symptoms, such as loss of strength and bowel control. He reports that during the tingling episodes he feels like he is weak, but is able to stand with support, and grasp objects normally. I advised him that he could keep his appointment with PT today and they could better evaluate what was appropriate for them to do based on his symptoms, and that I would see him as scheduled tomorrow to discuss further medical intervention and whether or not further imaging would be appropriate. He voiced understanding and agreement with the plan.

## 2022-12-03 ENCOUNTER — Ambulatory Visit (INDEPENDENT_AMBULATORY_CARE_PROVIDER_SITE_OTHER): Payer: BC Managed Care – PPO | Admitting: Family Medicine

## 2022-12-03 ENCOUNTER — Encounter: Payer: Self-pay | Admitting: Family Medicine

## 2022-12-03 VITALS — BP 119/80 | HR 66 | Ht 68.0 in | Wt 290.2 lb

## 2022-12-03 DIAGNOSIS — Z8739 Personal history of other diseases of the musculoskeletal system and connective tissue: Secondary | ICD-10-CM

## 2022-12-03 DIAGNOSIS — R7989 Other specified abnormal findings of blood chemistry: Secondary | ICD-10-CM

## 2022-12-03 DIAGNOSIS — S39012D Strain of muscle, fascia and tendon of lower back, subsequent encounter: Secondary | ICD-10-CM | POA: Diagnosis not present

## 2022-12-03 MED ORDER — GABAPENTIN 100 MG PO CAPS
100.0000 mg | ORAL_CAPSULE | Freq: Three times a day (TID) | ORAL | 3 refills | Status: DC
Start: 2022-12-03 — End: 2023-05-27

## 2022-12-03 MED ORDER — METHOCARBAMOL 750 MG PO TABS
750.0000 mg | ORAL_TABLET | Freq: Three times a day (TID) | ORAL | 0 refills | Status: DC | PRN
Start: 2022-12-03 — End: 2023-09-04

## 2022-12-03 MED ORDER — MELOXICAM 7.5 MG PO TABS
7.5000 mg | ORAL_TABLET | Freq: Every day | ORAL | 0 refills | Status: DC
Start: 2022-12-03 — End: 2023-05-27

## 2022-12-03 MED ORDER — ALLOPURINOL 100 MG PO TABS: 300.0000 mg | ORAL_TABLET | Freq: Every day | ORAL | Status: DC

## 2022-12-03 NOTE — Patient Instructions (Addendum)
It was wonderful to see you today!  Today we discussed your ongoing back pain. I prescribed three new medications for you, meloxicam, robaxin and gabapentin. Meloxicam- take this medication every day for the next two weeks. This medication should help with inflammation/swelling that could be compressing the nerves in your back and causing your pain. If at any point you feel your pain is well controlled, you can skip a day of the meloxicam. If after the first three days of taking meloxicam, you are still in pain, increase to two pills once a day. Robaxin- take this medication as needed up to three times a day for your muscle spasms. Do not take this medicine for the first time in the morning, as it can make you drowsy. You do not need to take this medication every day if you are not having symptoms. Gabapentin- this medication should help with the nerve irritation you have been having in your legs. You can take this medication the way you have been previously, 2 tablets or 200 mg at night and 1 tablet or 100 mg in the morning. It is also ok to take 1 tablet three times a day if you feel that this would give you more consistent relief. This medication works best when taken everyday, but can be taken as needed.   Please continue to see PT on the schedule they laid out for you. You can follow up with Dr. Melissa Noon in 4 weeks to see how you are doing and make any medication adjustments.   I have scheduled a lab appointment for you tomorrow morning at 9am to have your kidney function checked. If for some reason there is a change to your creatinine level, I will give you a call and we will adjust medications accordingly.   Please call [phone] with any questions about today's appointment.   If you need any additional refills, please call your pharmacy before calling the office.  Gerrit Heck, DO Family Medicine

## 2022-12-03 NOTE — Progress Notes (Signed)
    SUBJECTIVE:   CHIEF COMPLAINT / HPI:   F/u on back pain, discuss further imaging/treatments. Saw PT yesterday, they did not see any red flags despite increase in pain and spasms. Took gabapentin with some relief.  In office, he had several episodes of spasm that I witnessed. They were short duration, 5-10 seconds each, and exacerbated by twisting or lifting his legs. He is using a cane to ambulate as the spasms make it difficult to balance, and he is unsure when they will hit. His gait is slow and hesitant secondary to pain, and he has difficulty going from standing to sitting and sitting to standing.   PERTINENT  PMH / PSH: Cervical spine OA, lumbar spine OA  OBJECTIVE:   BP 119/80   Pulse 66   Ht 5\' 8"  (1.727 m)   Wt 290 lb 4 oz (131.7 kg)   SpO2 97%   BMI 44.13 kg/m   General: A&O, NAD Cardiac: RRR, no m/r/g Respiratory: CTAB, normal WOB, no w/c/r Extremities: NTTP, no peripheral edema. MSK/Neuro: Normal gait with slowing/hesitancy secondary to pain, moves all four extremities appropriately. Intact sensation and reflexes bilaterally in the lower extremities. Further exam limited due to pain. Appropriate strength when not in spasm.  ASSESSMENT/PLAN:   Lumbosacral strain Start meloxicam, robaxin and gabapentin. PT evaluation was suspicious for lumbar radiculopathy, which makes sense given the electric shock/burning sensation he described. Given his history of creatinine elevation, I scheduled him for a lab visit in the morning for a BMP. If there is a decrease in his GFR, I will change to a different pain controller to avoid further kidney injury. Continue with PT. Follow up with Dr. Melissa Noon at next scheduled visit.    Gerrit Heck, DO [facility]

## 2022-12-03 NOTE — Assessment & Plan Note (Signed)
Start meloxicam, robaxin and gabapentin. PT evaluation was suspicious for lumbar radiculopathy, which makes sense given the electric shock/burning sensation he described. Given his history of creatinine elevation, I scheduled him for a lab visit in the morning for a BMP. If there is a decrease in his GFR, I will change to a different pain controller to avoid further kidney injury. Continue with PT. Follow up with Dr. Melissa Noon at next scheduled visit.

## 2022-12-04 ENCOUNTER — Encounter: Payer: Self-pay | Admitting: Family Medicine

## 2022-12-04 ENCOUNTER — Other Ambulatory Visit: Payer: BC Managed Care – PPO

## 2022-12-04 DIAGNOSIS — R7989 Other specified abnormal findings of blood chemistry: Secondary | ICD-10-CM

## 2022-12-05 ENCOUNTER — Encounter: Payer: Self-pay | Admitting: Family Medicine

## 2022-12-05 ENCOUNTER — Ambulatory Visit: Payer: BC Managed Care – PPO

## 2022-12-05 DIAGNOSIS — M5416 Radiculopathy, lumbar region: Secondary | ICD-10-CM

## 2022-12-05 DIAGNOSIS — M5459 Other low back pain: Secondary | ICD-10-CM | POA: Diagnosis not present

## 2022-12-05 DIAGNOSIS — R2689 Other abnormalities of gait and mobility: Secondary | ICD-10-CM

## 2022-12-05 DIAGNOSIS — M62830 Muscle spasm of back: Secondary | ICD-10-CM

## 2022-12-05 LAB — SPECIMEN STATUS

## 2022-12-05 NOTE — Therapy (Signed)
OUTPATIENT PHYSICAL THERAPY THORACOLUMBAR TREATMENT   Patient Name: Tel Catton MRN: [id number] DOB:01-20-1976, 47 y.o., male Today's Date: 12/05/2022  END OF SESSION:  PT End of Session - 12/05/22 1626     Visit Number 2    Date for PT Re-Evaluation 01/27/23    Authorization Type MVA / State BCBS    PT Start Time 1534    PT Stop Time 1629    PT Time Calculation (min) 55 min    Activity Tolerance Patient limited by pain    Behavior During Therapy WFL for tasks assessed/performed              Past Medical History:  Diagnosis Date   Depression    GERD (gastroesophageal reflux disease)    Gout    Pilonidal cyst    Sleep apnea    Thyroid disease    Past Surgical History:  Procedure Laterality Date   CYST EXCISION     face   INCISION AND DRAINAGE Left 06/13/2021   Procedure: INCISION AND DRAINAGE LEFT LONG FINGER;  Surgeon: Betha Loa, MD;  Location: [facility];  Service: Orthopedics;  Laterality: Left;   LITHOTRIPSY  02/20/2021   PILONIDAL CYST EXCISION     WISDOM TOOTH EXTRACTION     Patient Active Problem List   Diagnosis Date Noted   Lumbosacral strain 11/15/2022   History of gout 05/14/2018   Arthralgia 12/25/2017   Prediabetes 12/25/2017   Right distal ureteral calculus 08/21/2015   Hypothyroidism 07/21/2015   Hypogonadism in male 07/21/2015   Obesity 07/21/2015   Sleep apnea 07/21/2015    PCP: Darral Dash, DO   REFERRING PROVIDER: McDiarmid, Leighton Roach, MD   REFERRING DIAG: M54.50,G89.29 (ICD-10-CM) - Chronic bilateral low back pain without sciatica   THERAPY DIAG:  Other low back pain  Radiculopathy, lumbar region  Muscle spasm of back  Other abnormalities of gait and mobility  RATIONALE FOR EVALUATION AND TREATMENT: Rehabilitation  ONSET DATE: 05/17/22  NEXT MD VISIT: 12/03/22 with Gerrit Heck, DO; 12/24/22 with PCP   SUBJECTIVE:                                                                                                                                                                                                          SUBJECTIVE STATEMENT: Pt reports improved pain from medications. Put on Meloxicam and Gabapentin for pain.  PAIN: Are you having pain? Yes: NPRS scale: 2/10 Pain location: B thoracolumbar junction (mid/low back) Pain description: constant pressure, intermittent intense stabbing, numbness into B LE with prolonged sitting  Aggravating factors: random/unpredictable at times, transitional movements twisting, throwing motions Relieving factors: nothing, gabapentin  PERTINENT HISTORY:  Prediabetes, obesity hypothyroidism, arthralgia, history of gout, sleep apnea, depression, GERD  PRECAUTIONS: None  RED FLAGS: None  WEIGHT BEARING RESTRICTIONS: No  FALLS:  Has patient fallen in last 6 months? 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 Return to normal activities pain free."   OBJECTIVE: (objective measures completed at initial evaluation unless otherwise dated)  DIAGNOSTIC FINDINGS:  11/24/22 - DG Lumbar Spine Complete: FINDINGS: There is no evidence of lumbar spine fracture. Alignment is normal. Minimal osteoarthritic changes with mild disc space narrowing and anterior osteophyte formation. 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  PATIENT SURVEYS:  Modified Oswestry 21 / 50 = 42.0 %  FOTO Lumbar spine = 34, predicted = 55  SCREENING FOR RED FLAGS: Bowel or bladder incontinence: No Spinal tumors: No Cauda equina syndrome: No Compression fracture: No Abdominal aneurysm: No  COGNITION:  Overall cognitive status: Within functional limits for tasks assessed    SENSATION: WFL Pt  reports intermittent numbness into B LE with prolonged sitting  MUSCLE LENGTH: (unable to assess on eval due to movement intolerance) Hamstrings:  ITB:  Piriformis:  Hip flexors:  Quads:  Heelcord:   POSTURE:  decreased lumbar lordosis  PALPATION: Increased muscle tension/spasms in thoracolumbar paraspinals and QL - most tender at thoracolumbar junction  LUMBAR ROM:   Active  Eval   Flexion Hands to knees ^  Extension 50% limited ^  Right lateral flexion Unable ^  Left lateral flexion Hand to mid thigh ^  Right rotation Unable ^  Left rotation Unable ^  (Blank rows = not tested, ^ = increased pain)  LOWER EXTREMITY ROM:  (unable to assess on eval due to movement intolerance)  Active  Right eval Left eval  Hip flexion    Hip extension    Hip abduction    Hip adduction    Hip internal rotation    Hip external rotation    Knee flexion    Knee extension    Ankle dorsiflexion    Ankle plantarflexion    Ankle inversion    Ankle eversion    (Blank rows = not tested)  LOWER EXTREMITY MMT:  (tested in sitting)  MMT Right eval Left eval  Hip flexion 4+ 4+  Hip extension    Hip abduction 5 5  Hip adduction 4+ 4+  Hip internal rotation 5 5  Hip external rotation 4+ 5  Knee flexion 5 5  Knee extension 5 5  Ankle dorsiflexion 5 5  Ankle plantarflexion    Ankle inversion    Ankle eversion     (Blank rows = not tested)  LUMBAR SPECIAL TESTS:  Slump test: Negative   TODAY'S TREATMENT:  12/05/22 Therapeutic Exercise: to improve strength and mobility.  Demo, verbal and tactile cues throughout for technique.  Nustep L2x54min Supine PPT 10x3" Supine LTR x 10 each way Supine bent knee fallouts 10x3" bil Supine heel slides x 10 bil Supine marches x10 bil Estim IFC 80-150 Hz to thoracolumbar paraspinals x 12 min with CP  12/02/22 - Eval MODALITIES: TENS to B thoracolumbar paraspinals x 20" Ice pack to thoracolumbar paraspinals x 20"   PATIENT EDUCATION:  Education details: PT eval findings, anticipated POC, need for further assessment of lumbar mobility and proximal LE flexibility, home TENS unit options, and TENS electrode placement and settings Person educated: Patient and Spouse Education method: Explanation and Demonstration Education comprehension: verbalized understanding  HOME EXERCISE PROGRAM: Access Code: [email or web address] URL: [email or web address] Date: 12/05/2022 Prepared by: Verta Ellen  Exercises - Supine Posterior Pelvic Tilt  - 1 x daily - 7 x weekly - 3 sets - 10 reps - 3 sec hold - Supine Lower Trunk Rotation  - 1 x daily - 7 x weekly - 3 sets - 10 reps - 5 sec hold - Bent Knee Fallouts  - 1 x daily - 7 x weekly - 3 sets - 10 reps - Supine March  - 1 x daily - 7 x weekly - 3 sets - 10 reps   ASSESSMENT:  CLINICAL IMPRESSION: Pt able to tolerate more exercise and movement today d/t being put on medications. Pain was minimal to none during today's session except with transitional movements such as sitting to supine and vice versa. Cues given throughout session for gentle movement Concluded session with estim for pain control with CP Latray will benefit from skilled PT to address above deficits to improve mobility and activity tolerance with decreased pain interference.   OBJECTIVE IMPAIRMENTS: Abnormal gait, decreased activity tolerance, decreased balance, decreased endurance, decreased knowledge of condition, decreased knowledge of use of DME, decreased mobility, difficulty walking, decreased ROM, decreased strength, hypomobility, increased fascial restrictions, impaired perceived functional ability, increased muscle spasms, impaired flexibility, impaired sensation, improper body mechanics, postural dysfunction, and pain.   ACTIVITY LIMITATIONS: carrying, lifting, bending, sitting, standing, squatting, stairs, transfers, bed mobility, bathing, toileting, dressing, hygiene/grooming, locomotion level, and caring for  others  PARTICIPATION LIMITATIONS: meal prep, cleaning, laundry, interpersonal relationship, driving, shopping, community activity, occupation, yard work, and school  PERSONAL FACTORS: Past/current experiences, Time since onset of injury/illness/exacerbation, and 3+ comorbidities: Prediabetes, obesity hypothyroidism, arthralgia, history of gout, sleep apnea, depression, GERD  are also affecting patient's functional outcome.   REHAB POTENTIAL: Good  CLINICAL DECISION MAKING: Evolving/moderate complexity  EVALUATION COMPLEXITY: Moderate   GOALS: Goals reviewed with patient? Yes  SHORT TERM GOALS: Target date: 12/30/2022  Patient will be independent with initial HEP to improve outcomes and carryover.  Baseline: TBD Goal status: INITIAL  2.  Patient will report centralization of radicular symptoms.  Baseline: Intermittent B LE numbness typically with prolonged sitting (patient noted to sit at edge of chair with legs pressing down into edge of chair) Goal status: INITIAL  LONG TERM GOALS: Target date: 01/27/2023   Patient will be independent with ongoing/advanced HEP for self-management at home.  Baseline:  Goal status: INITIAL  2.  Patient will report at least 50-75% improvement in low back pain to improve QOL.  Baseline: at rest (sitting) 2/10; with movement up to 9-10/10 Goal status: INITIAL  3.  Patient to demonstrate ability to achieve and maintain good spinal alignment/posturing and body mechanics needed for daily activities. Baseline:  Goal status: INITIAL  4.  Patient will demonstrate functional pain free lumbar ROM to perform ADLs.   Baseline: Refer to above lumbar ROM table  Goal status: INITIAL  5.  Patient will demonstrate improved B LE strength to 5/5 for improved stability and ease of mobility . Baseline: Refer to above LE MMT table Goal status: INITIAL  6.  Patient will report 18 on lumbar FOTO to demonstrate improved functional ability.  Baseline: FOTO  Lumbar spine = 34 Goal status:  INITIAL   7. Patient will report </= 30% on Modified Oswestry to demonstrate improved functional ability with decreased pain interference. Baseline: Modified Oswestry 21 / 50 = 42.0 %  Goal status: INITIAL  8.  Patient will tolerate transitional movements w/o increased pain to allow for improved mobility and activity tolerance. Baseline: pain up to 9/10 with most transitional movements  Goal status: INITIAL  9.  Patient to report ability to perform ADLs, household, and work-related tasks without limitation due to low back pain, muscle spasm, LOM or weakness Baseline:  Goal status: INITIAL   PLAN:  PT FREQUENCY: 2x/week  PT DURATION: 8 weeks  PLANNED INTERVENTIONS: Therapeutic exercises, Therapeutic activity, Neuromuscular re-education, Balance training, Gait training, Patient/Family education, Self Care, Joint mobilization, Aquatic Therapy, Dry Needling, Electrical stimulation, Spinal manipulation, Spinal mobilization, Cryotherapy, Moist heat, Taping, Vasopneumatic device, Traction, Ultrasound, Manual therapy, and Re-evaluation  PLAN FOR NEXT SESSION: Review HEP for gentle lumbopelvic flexibility, ROM and stabilization; posture and body mechanics education; MT +/- DN and modalities PRN for pain management   Darleene Cleaver, PTA 12/05/2022, 5:08 PM

## 2022-12-09 ENCOUNTER — Ambulatory Visit: Payer: BC Managed Care – PPO

## 2022-12-09 DIAGNOSIS — R2689 Other abnormalities of gait and mobility: Secondary | ICD-10-CM

## 2022-12-09 DIAGNOSIS — M62830 Muscle spasm of back: Secondary | ICD-10-CM

## 2022-12-09 DIAGNOSIS — M5459 Other low back pain: Secondary | ICD-10-CM

## 2022-12-09 DIAGNOSIS — M5416 Radiculopathy, lumbar region: Secondary | ICD-10-CM

## 2022-12-09 NOTE — Therapy (Signed)
OUTPATIENT PHYSICAL THERAPY THORACOLUMBAR TREATMENT   Patient Name: Marc Fisher MRN: [id number] DOB:12/15/1975, 47 y.o., male Today's Date: 12/09/2022  END OF SESSION:  PT End of Session - 12/09/22 1658     Visit Number 3    Date for PT Re-Evaluation 01/27/23    Authorization Type MVA / State BCBS    PT Start Time 1624   pt late   PT Stop Time 1710    PT Time Calculation (min) 46 min    Activity Tolerance Patient limited by pain;Patient tolerated treatment well    Behavior During Therapy WFL for tasks assessed/performed               Past Medical History:  Diagnosis Date   Depression    GERD (gastroesophageal reflux disease)    Gout    Pilonidal cyst    Sleep apnea    Thyroid disease    Past Surgical History:  Procedure Laterality Date   CYST EXCISION     face   INCISION AND DRAINAGE Left 06/13/2021   Procedure: INCISION AND DRAINAGE LEFT LONG FINGER;  Surgeon: Betha Loa, MD;  Location: [facility];  Service: Orthopedics;  Laterality: Left;   LITHOTRIPSY  02/20/2021   PILONIDAL CYST EXCISION     WISDOM TOOTH EXTRACTION     Patient Active Problem List   Diagnosis Date Noted   Lumbosacral strain 11/15/2022   History of gout 05/14/2018   Arthralgia 12/25/2017   Prediabetes 12/25/2017   Right distal ureteral calculus 08/21/2015   Hypothyroidism 07/21/2015   Hypogonadism in male 07/21/2015   Obesity 07/21/2015   Sleep apnea 07/21/2015    PCP: Darral Dash, DO   REFERRING PROVIDER: McDiarmid, Leighton Roach, MD   REFERRING DIAG: M54.50,G89.29 (ICD-10-CM) - Chronic bilateral low back pain without sciatica   THERAPY DIAG:  Other low back pain  Radiculopathy, lumbar region  Muscle spasm of back  Other abnormalities of gait and mobility  RATIONALE FOR EVALUATION AND TREATMENT: Rehabilitation  ONSET DATE: 05/17/22  NEXT MD VISIT: 12/03/22 with Gerrit Heck, DO; 12/24/22 with PCP   SUBJECTIVE:                                                                                                                                                                                                          SUBJECTIVE STATEMENT: Pt reports improved pain, TENS unit helps.  PAIN: Are you having pain? Yes: NPRS scale: 1/10 Pain location: B thoracolumbar junction (mid/low back) Pain description: constant pressure, intermittent intense stabbing, numbness into B LE with prolonged  sitting Aggravating factors: random/unpredictable at times, transitional movements twisting, throwing motions Relieving factors: nothing, gabapentin  PERTINENT HISTORY:  Prediabetes, obesity hypothyroidism, arthralgia, history of gout, sleep apnea, depression, GERD  PRECAUTIONS: None  RED FLAGS: None  WEIGHT BEARING RESTRICTIONS: No  FALLS:  Has patient fallen in last 6 months? 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 Return to normal activities pain free."   OBJECTIVE: (objective measures completed at initial evaluation unless otherwise dated)  DIAGNOSTIC FINDINGS:  11/24/22 - DG Lumbar Spine Complete: FINDINGS: There is no evidence of lumbar spine fracture. Alignment is normal. Minimal osteoarthritic changes with mild disc space narrowing and anterior osteophyte formation. 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  PATIENT SURVEYS:  Modified Oswestry 21 / 50 = 42.0 %  FOTO Lumbar spine = 34, predicted = 55  SCREENING FOR RED FLAGS: Bowel or bladder incontinence: No Spinal tumors: No Cauda equina syndrome: No Compression fracture: No Abdominal aneurysm: No  COGNITION:  Overall cognitive status: Within functional limits for tasks  assessed    SENSATION: WFL Pt reports intermittent numbness into B LE with prolonged sitting  MUSCLE LENGTH: (unable to assess on eval due to movement intolerance) Hamstrings:  ITB:  Piriformis:  Hip flexors:  Quads:  Heelcord:   POSTURE:  decreased lumbar lordosis  PALPATION: Increased muscle tension/spasms in thoracolumbar paraspinals and QL - most tender at thoracolumbar junction  LUMBAR ROM:   Active  Eval   Flexion Hands to knees ^  Extension 50% limited ^  Right lateral flexion Unable ^  Left lateral flexion Hand to mid thigh ^  Right rotation Unable ^  Left rotation Unable ^  (Blank rows = not tested, ^ = increased pain)  LOWER EXTREMITY ROM:  (unable to assess on eval due to movement intolerance)  Active  Right eval Left eval  Hip flexion    Hip extension    Hip abduction    Hip adduction    Hip internal rotation    Hip external rotation    Knee flexion    Knee extension    Ankle dorsiflexion    Ankle plantarflexion    Ankle inversion    Ankle eversion    (Blank rows = not tested)  LOWER EXTREMITY MMT:  (tested in sitting)  MMT Right eval Left eval  Hip flexion 4+ 4+  Hip extension    Hip abduction 5 5  Hip adduction 4+ 4+  Hip internal rotation 5 5  Hip external rotation 4+ 5  Knee flexion 5 5  Knee extension 5 5  Ankle dorsiflexion 5 5  Ankle plantarflexion    Ankle inversion    Ankle eversion     (Blank rows = not tested)  LUMBAR SPECIAL TESTS:  Slump test: Negative   TODAY'S TREATMENT:  12/09/22 Therapeutic Exercise: to improve strength and mobility.  Demo, verbal and tactile cues throughout for technique.  Nustep L4x24min Supine LTR x 10 each way Open book stretch x 10 R side Lumbar extensions at wall Seated lumbar extension x 10   Manual therapy: STM to R thoracolumbar paraspinals in S/L  Therapeutic Activity: Reviewed bed mobility techniques to decreased strain on LB, rolling in bed  12/05/22 Therapeutic Exercise: to  improve strength and mobility.  Demo,  verbal and tactile cues throughout for technique.  Nustep L2x44min Supine PPT 10x3" Supine LTR x 10 each way Supine bent knee fallouts 10x3" bil Supine heel slides x 10 bil Supine marches x10 bil Estim IFC 80-150 Hz to thoracolumbar paraspinals x 12 min with CP  12/02/22 - Eval MODALITIES: TENS to B thoracolumbar paraspinals x 20" Ice pack to thoracolumbar paraspinals x 20"   PATIENT EDUCATION:  Education details: PT eval findings, anticipated POC, need for further assessment of lumbar mobility and proximal LE flexibility, home TENS unit options, and TENS electrode placement and settings Person educated: Patient and Spouse Education method: Explanation and Demonstration Education comprehension: verbalized understanding  HOME EXERCISE PROGRAM: Access Code: [email or web address] URL: [email or web address] Date: 12/09/2022 Prepared by: Verta Ellen  Exercises - Supine Posterior Pelvic Tilt  - 1 x daily - 7 x weekly - 3 sets - 10 reps - 3 sec hold - Supine Lower Trunk Rotation  - 1 x daily - 7 x weekly - 3 sets - 10 reps - 5 sec hold - Bent Knee Fallouts  - 1 x daily - 7 x weekly - 3 sets - 10 reps - Supine March  - 1 x daily - 7 x weekly - 3 sets - 10 reps - Sidelying Thoracic Rotation with Open Book  - 1 x daily - 7 x weekly - 2 sets - 10 reps - 5 sec  hold   ASSESSMENT:  CLINICAL IMPRESSION: Reviewed bed mobility with patient to reduce strain on low back, pt did not wish to practice this so only did a demo. Good response to [facility] today with less muscle tension noted, he only able to lie on his L side comfortably. Added open book stretch to HEP to work on twisting movements for bed mobility. Pt also needs encouragement for full HEP compliance. He declined modalities today. Aisha will benefit from skilled PT to address above deficits to improve mobility and activity tolerance with decreased pain interference.   OBJECTIVE IMPAIRMENTS: Abnormal  gait, decreased activity tolerance, decreased balance, decreased endurance, decreased knowledge of condition, decreased knowledge of use of DME, decreased mobility, difficulty walking, decreased ROM, decreased strength, hypomobility, increased fascial restrictions, impaired perceived functional ability, increased muscle spasms, impaired flexibility, impaired sensation, improper body mechanics, postural dysfunction, and pain.   ACTIVITY LIMITATIONS: carrying, lifting, bending, sitting, standing, squatting, stairs, transfers, bed mobility, bathing, toileting, dressing, hygiene/grooming, locomotion level, and caring for others  PARTICIPATION LIMITATIONS: meal prep, cleaning, laundry, interpersonal relationship, driving, shopping, community activity, occupation, yard work, and school  PERSONAL FACTORS: Past/current experiences, Time since onset of injury/illness/exacerbation, and 3+ comorbidities: Prediabetes, obesity hypothyroidism, arthralgia, history of gout, sleep apnea, depression, GERD  are also affecting patient's functional outcome.   REHAB POTENTIAL: Good  CLINICAL DECISION MAKING: Evolving/moderate complexity  EVALUATION COMPLEXITY: Moderate   GOALS: Goals reviewed with patient? Yes  SHORT TERM GOALS: Target date: 12/30/2022  Patient will be independent with initial HEP to improve outcomes and carryover.  Baseline: TBD Goal status: IN PROGRESS  2.  Patient will report centralization of radicular symptoms.  Baseline: Intermittent B LE numbness typically with prolonged sitting (patient noted to sit at edge of chair with legs pressing down into edge of chair) Goal status: IN PROGRESS  LONG TERM GOALS: Target date: 01/27/2023   Patient will be independent with ongoing/advanced HEP for self-management at home.  Baseline:  Goal status: IN PROGRESS  2.  Patient will report at least 50-75% improvement in low back pain to improve QOL.  Baseline:  at rest (sitting) 2/10; with movement  up to 9-10/10 Goal status: IN PROGRESS  3.  Patient to demonstrate ability to achieve and maintain good spinal alignment/posturing and body mechanics needed for daily activities. Baseline:  Goal status: IN PROGRESS  4.  Patient will demonstrate functional pain free lumbar ROM to perform ADLs.   Baseline: Refer to above lumbar ROM table  Goal status: IN PROGRESS  5.  Patient will demonstrate improved B LE strength to 5/5 for improved stability and ease of mobility . Baseline: Refer to above LE MMT table Goal status: IN PROGRESS  6.  Patient will report 49 on lumbar FOTO to demonstrate improved functional ability.  Baseline: FOTO Lumbar spine = 34 Goal status: IN PROGRESS   7. Patient will report </= 30% on Modified Oswestry to demonstrate improved functional ability with decreased pain interference. Baseline: Modified Oswestry 21 / 50 = 42.0 %  Goal status: IN PROGRESS  8.  Patient will tolerate transitional movements w/o increased pain to allow for improved mobility and activity tolerance. Baseline: pain up to 9/10 with most transitional movements  Goal status: IN PROGRESS  9.  Patient to report ability to perform ADLs, household, and work-related tasks without limitation due to low back pain, muscle spasm, LOM or weakness Baseline:  Goal status: IN PROGRESS   PLAN:  PT FREQUENCY: 2x/week  PT DURATION: 8 weeks  PLANNED INTERVENTIONS: Therapeutic exercises, Therapeutic activity, Neuromuscular re-education, Balance training, Gait training, Patient/Family education, Self Care, Joint mobilization, Aquatic Therapy, Dry Needling, Electrical stimulation, Spinal manipulation, Spinal mobilization, Cryotherapy, Moist heat, Taping, Vasopneumatic device, Traction, Ultrasound, Manual therapy, and Re-evaluation  PLAN FOR NEXT SESSION: gentle lumbopelvic flexibility, ROM and stabilization; posture and body mechanics education; MT +/- DN and modalities PRN for pain management   Darleene Cleaver, PTA 12/09/2022, 5:14 PM

## 2022-12-10 LAB — BASIC METABOLIC PANEL
BUN/Creatinine Ratio: 14 (ref 9–20)
BUN: 17 mg/dL (ref 6–24)
CO2: 23 mmol/L (ref 20–29)
Calcium: 9.4 mg/dL (ref 8.7–10.2)
Chloride: 102 mmol/L (ref 96–106)
Creatinine, Ser: 1.24 mg/dL (ref 0.76–1.27)
Glucose: 124 mg/dL — ABNORMAL HIGH (ref 70–99)
Potassium: 4.7 mmol/L (ref 3.5–5.2)
Sodium: 142 mmol/L (ref 134–144)
eGFR: 74 mL/min/{1.73_m2} (ref >59)

## 2022-12-10 LAB — SPECIMEN STATUS REPORT

## 2022-12-11 ENCOUNTER — Ambulatory Visit: Payer: BC Managed Care – PPO | Admitting: Student

## 2022-12-12 ENCOUNTER — Ambulatory Visit: Payer: BC Managed Care – PPO | Admitting: Physical Therapy

## 2022-12-12 ENCOUNTER — Encounter: Payer: Self-pay | Admitting: Physical Therapy

## 2022-12-12 DIAGNOSIS — M5459 Other low back pain: Secondary | ICD-10-CM | POA: Diagnosis not present

## 2022-12-12 DIAGNOSIS — M5416 Radiculopathy, lumbar region: Secondary | ICD-10-CM

## 2022-12-12 DIAGNOSIS — R2689 Other abnormalities of gait and mobility: Secondary | ICD-10-CM

## 2022-12-12 DIAGNOSIS — M62830 Muscle spasm of back: Secondary | ICD-10-CM

## 2022-12-12 NOTE — Therapy (Signed)
OUTPATIENT PHYSICAL THERAPY THORACOLUMBAR TREATMENT   Patient Name: Marc Fisher MRN: [id number] DOB:14-Oct-1975, 47 y.o., male Today's Date: 12/12/2022  END OF SESSION:  PT End of Session - 12/12/22 1533     Visit Number 4    Date for PT Re-Evaluation 01/27/23    Authorization Type MVA / State BCBS    PT Start Time 1533    PT Stop Time 1616    PT Time Calculation (min) 43 min    Activity Tolerance Patient tolerated treatment well    Behavior During Therapy WFL for tasks assessed/performed                Past Medical History:  Diagnosis Date   Depression    GERD (gastroesophageal reflux disease)    Gout    Pilonidal cyst    Sleep apnea    Thyroid disease    Past Surgical History:  Procedure Laterality Date   CYST EXCISION     face   INCISION AND DRAINAGE Left 06/13/2021   Procedure: INCISION AND DRAINAGE LEFT LONG FINGER;  Surgeon: Betha Loa, MD;  Location: [facility];  Service: Orthopedics;  Laterality: Left;   LITHOTRIPSY  02/20/2021   PILONIDAL CYST EXCISION     WISDOM TOOTH EXTRACTION     Patient Active Problem List   Diagnosis Date Noted   Lumbosacral strain 11/15/2022   History of gout 05/14/2018   Arthralgia 12/25/2017   Prediabetes 12/25/2017   Right distal ureteral calculus 08/21/2015   Hypothyroidism 07/21/2015   Hypogonadism in male 07/21/2015   Obesity 07/21/2015   Sleep apnea 07/21/2015    PCP: Darral Dash, DO   REFERRING PROVIDER: McDiarmid, Leighton Roach, MD   REFERRING DIAG: M54.50,G89.29 (ICD-10-CM) - Chronic bilateral low back pain without sciatica   THERAPY DIAG:  Other low back pain  Radiculopathy, lumbar region  Muscle spasm of back  Other abnormalities of gait and mobility  RATIONALE FOR EVALUATION AND TREATMENT: Rehabilitation  ONSET DATE: 05/17/22  NEXT MD VISIT: 12/24/22 with PCP   SUBJECTIVE:                                                                                                                                                                                                          SUBJECTIVE STATEMENT: Pt reports pain has been much better and he has been able to cut back on taking some of the meds.  Still gets muscle spasms when trying to reposition in bed.  PAIN: Are you having pain? Yes: NPRS scale: 1/10 Pain location: B thoracolumbar junction (  mid/low back) Pain description: constant pressure, intermittent intense stabbing, numbness into B LE with prolonged sitting Aggravating factors: random/unpredictable at times, transitional movements twisting, throwing motions Relieving factors: nothing, gabapentin  PERTINENT HISTORY:  Prediabetes, obesity hypothyroidism, arthralgia, history of gout, sleep apnea, depression, GERD  PRECAUTIONS: None  RED FLAGS: None  WEIGHT BEARING RESTRICTIONS: No  FALLS:  Has patient fallen in last 6 months? 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 Return to normal activities pain free."   OBJECTIVE: (objective measures completed at initial evaluation unless otherwise dated)  DIAGNOSTIC FINDINGS:  11/24/22 - DG Lumbar Spine Complete: FINDINGS: There is no evidence of lumbar spine fracture. Alignment is normal. Minimal osteoarthritic changes with mild disc space narrowing and anterior osteophyte formation. 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  PATIENT SURVEYS:  Modified Oswestry 21 / 50 = 42.0 %  FOTO Lumbar spine = 34, predicted = 55  SCREENING FOR RED FLAGS: Bowel or bladder incontinence: No Spinal tumors: No Cauda equina syndrome: No Compression fracture: No Abdominal aneurysm: No  COGNITION:  Overall cognitive status: Within  functional limits for tasks assessed    SENSATION: WFL Pt reports intermittent numbness into B LE with prolonged sitting  MUSCLE LENGTH: (unable to assess on eval due to movement intolerance) Hamstrings:  ITB:  Piriformis:  Hip flexors:  Quads:  Heelcord:   POSTURE:  decreased lumbar lordosis  PALPATION: Increased muscle tension/spasms in thoracolumbar paraspinals and QL - most tender at thoracolumbar junction  LUMBAR ROM:   Active  Eval   Flexion Hands to knees ^  Extension 50% limited ^  Right lateral flexion Unable ^  Left lateral flexion Hand to mid thigh ^  Right rotation Unable ^  Left rotation Unable ^  (Blank rows = not tested, ^ = increased pain)  LOWER EXTREMITY ROM:  (unable to assess on eval due to movement intolerance)  Active  Right eval Left eval  Hip flexion    Hip extension    Hip abduction    Hip adduction    Hip internal rotation    Hip external rotation    Knee flexion    Knee extension    Ankle dorsiflexion    Ankle plantarflexion    Ankle inversion    Ankle eversion    (Blank rows = not tested)  LOWER EXTREMITY MMT:  (tested in sitting)  MMT Right eval Left eval  Hip flexion 4+ 4+  Hip extension    Hip abduction 5 5  Hip adduction 4+ 4+  Hip internal rotation 5 5  Hip external rotation 4+ 5  Knee flexion 5 5  Knee extension 5 5  Ankle dorsiflexion 5 5  Ankle plantarflexion    Ankle inversion    Ankle eversion     (Blank rows = not tested)  LUMBAR SPECIAL TESTS:  Slump test: Negative   TODAY'S TREATMENT:   12/12/22 THERAPEUTIC EXERCISE: to improve flexibility, strength and mobility.  Demonstration, verbal and tactile cues throughout for technique.  NuStep - L4 x 8 min Hooklying LTR x 10 each way Hooklying alt RTB bent-knee fallout x 10 Hooklying RTB brace marching x 10  MANUAL THERAPY: To promote normalized muscle tension, improved flexibility, and reduced pain. Skilled palpation and monitoring of soft tissue  during  DN Trigger Point Dry-Needling  Treatment instructions: Expect mild to moderate muscle soreness. S/S of pneumothorax if dry needled over a lung field, and to seek immediate medical attention should they occur. Patient verbalized understanding of these instructions and education. Patient Consent Given: Yes Education handout provided: Yes Muscles treated: B lumbar multifidi Electrical stimulation performed: No Parameters: N/A Treatment response/outcome: Twitch Response Elicited and Palpable Increase in Muscle Length STM/DTM, manual TPR and pin & stretch to muscles addressed with DN IASTM with foam roller to B thoracolumbar paraspinals and glutes/piriformis   12/09/22 Therapeutic Exercise: to improve strength and mobility.  Demo, verbal and tactile cues throughout for technique.  Nustep L4x47min Supine LTR x 10 each way Open book stretch x 10 R side Lumbar extensions at wall Seated lumbar extension x 10   Manual therapy: STM to R thoracolumbar paraspinals in S/L  Therapeutic Activity: Reviewed bed mobility techniques to decreased strain on LB, rolling in bed   12/05/22 Therapeutic Exercise: to improve strength and mobility.  Demo, verbal and tactile cues throughout for technique.  Nustep L2x43min Supine PPT 10x3" Supine LTR x 10 each way Supine bent knee fallouts 10x3" bil Supine heel slides x 10 bil Supine marches x10 bil Estim IFC 80-150 Hz to thoracolumbar paraspinals x 12 min with CP    PATIENT EDUCATION:  Education details: role of DN and DN rational, procedure, outcomes, potential side effects, and recommended post-treatment exercises/activity Person educated: Patient Education method: Explanation and Handouts Education comprehension: verbalized understanding  HOME EXERCISE PROGRAM: Access Code: [email or web address] URL: [email or web address] Date: 12/09/2022 Prepared by: Verta Ellen  Exercises - Supine Posterior Pelvic Tilt  - 1 x daily - 7 x weekly - 3 sets -  10 reps - 3 sec hold - Supine Lower Trunk Rotation  - 1 x daily - 7 x weekly - 3 sets - 10 reps - 5 sec hold - Bent Knee Fallouts  - 1 x daily - 7 x weekly - 3 sets - 10 reps - Supine March  - 1 x daily - 7 x weekly - 3 sets - 10 reps - Sidelying Thoracic Rotation with Open Book  - 1 x daily - 7 x weekly - 2 sets - 10 reps - 5 sec  hold   ASSESSMENT:  CLINICAL IMPRESSION: Domenique reports his pain has been improving allowing him to lessen the amount of pain meds and muscle relaxants he has needed to take but still notes his back wants to spasm with scooting, rolling or repositioning in bed. Increased muscle tension and TTP still present in lumbar paraspinals which appeared amenable to DN.  After explanation of DN rational, procedures, outcomes and potential side effects, patient verbalized consent to DN treatment in conjunction with manual STM/DTM and TPR to reduce ttp/muscle tension. Muscles treated as indicated above. DN produced normal response with good twitches elicited resulting in palpable reduction in pain/ttp and muscle tension, however patient experiencing autonomic response with increased sweating and feeling of nausea, therefore DN limited to only 2 spots and emphasis shifted to manual STM/DTM and IASTM. Pt educated to expect mild to moderate muscle soreness for up to 24-48 hrs and instructed to continue prescribed HEP and current activity level with pt verbalizing understanding of these instructions. Session concluded review and gentle progression of initial HEP lumbopelvic flexibility and strengthening, adding RTB resistance to hooklying bent knee fallout and marches. Demetrius will benefit from continued skilled PT to address ongoing deficits to improve mobility and activity tolerance with decreased pain  interference.   OBJECTIVE IMPAIRMENTS: Abnormal gait, decreased activity tolerance, decreased balance, decreased endurance, decreased knowledge of condition, decreased knowledge of use of DME,  decreased mobility, difficulty walking, decreased ROM, decreased strength, hypomobility, increased fascial restrictions, impaired perceived functional ability, increased muscle spasms, impaired flexibility, impaired sensation, improper body mechanics, postural dysfunction, and pain.   ACTIVITY LIMITATIONS: carrying, lifting, bending, sitting, standing, squatting, stairs, transfers, bed mobility, bathing, toileting, dressing, hygiene/grooming, locomotion level, and caring for others  PARTICIPATION LIMITATIONS: meal prep, cleaning, laundry, interpersonal relationship, driving, shopping, community activity, occupation, yard work, and school  PERSONAL FACTORS: Past/current experiences, Time since onset of injury/illness/exacerbation, and 3+ comorbidities: Prediabetes, obesity hypothyroidism, arthralgia, history of gout, sleep apnea, depression, GERD  are also affecting patient's functional outcome.   REHAB POTENTIAL: Good  CLINICAL DECISION MAKING: Evolving/moderate complexity  EVALUATION COMPLEXITY: Moderate   GOALS: Goals reviewed with patient? Yes  SHORT TERM GOALS: Target date: 12/30/2022  Patient will be independent with initial HEP to improve outcomes and carryover.  Baseline: TBD Goal status: IN PROGRESS  2.  Patient will report centralization of radicular symptoms.  Baseline: Intermittent B LE numbness typically with prolonged sitting (patient noted to sit at edge of chair with legs pressing down into edge of chair) Goal status: IN PROGRESS  LONG TERM GOALS: Target date: 01/27/2023   Patient will be independent with ongoing/advanced HEP for self-management at home.  Baseline:  Goal status: IN PROGRESS  2.  Patient will report at least 50-75% improvement in low back pain to improve QOL.  Baseline: at rest (sitting) 2/10; with movement up to 9-10/10 Goal status: IN PROGRESS  3.  Patient to demonstrate ability to achieve and maintain good spinal alignment/posturing and body  mechanics needed for daily activities. Baseline:  Goal status: IN PROGRESS  4.  Patient will demonstrate functional pain free lumbar ROM to perform ADLs.   Baseline: Refer to above lumbar ROM table  Goal status: IN PROGRESS  5.  Patient will demonstrate improved B LE strength to 5/5 for improved stability and ease of mobility . Baseline: Refer to above LE MMT table Goal status: IN PROGRESS  6.  Patient will report 55 on lumbar FOTO to demonstrate improved functional ability.  Baseline: FOTO Lumbar spine = 34 Goal status: IN PROGRESS   7. Patient will report </= 30% on Modified Oswestry to demonstrate improved functional ability with decreased pain interference. Baseline: Modified Oswestry 21 / 50 = 42.0 %  Goal status: IN PROGRESS  8.  Patient will tolerate transitional movements w/o increased pain to allow for improved mobility and activity tolerance. Baseline: pain up to 9/10 with most transitional movements  Goal status: IN PROGRESS  9.  Patient to report ability to perform ADLs, household, and work-related tasks without limitation due to low back pain, muscle spasm, LOM or weakness Baseline:  Goal status: IN PROGRESS   PLAN:  PT FREQUENCY: 2x/week  PT DURATION: 8 weeks  PLANNED INTERVENTIONS: Therapeutic exercises, Therapeutic activity, Neuromuscular re-education, Balance training, Gait training, Patient/Family education, Self Care, Joint mobilization, Aquatic Therapy, Dry Needling, Electrical stimulation, Spinal manipulation, Spinal mobilization, Cryotherapy, Moist heat, Taping, Vasopneumatic device, Traction, Ultrasound, Manual therapy, and Re-evaluation  PLAN FOR NEXT SESSION: Assess response to DN, gentle lumbopelvic flexibility, ROM and stabilization; posture and body mechanics education; MT +/- DN and modalities PRN for pain management   Marry Guan, PT 12/12/2022, 4:18 PM

## 2022-12-16 ENCOUNTER — Encounter: Payer: Self-pay | Admitting: Physical Therapy

## 2022-12-16 ENCOUNTER — Ambulatory Visit: Payer: BC Managed Care – PPO | Admitting: Physical Therapy

## 2022-12-16 DIAGNOSIS — R2689 Other abnormalities of gait and mobility: Secondary | ICD-10-CM

## 2022-12-16 DIAGNOSIS — M62830 Muscle spasm of back: Secondary | ICD-10-CM

## 2022-12-16 DIAGNOSIS — M5459 Other low back pain: Secondary | ICD-10-CM | POA: Diagnosis not present

## 2022-12-16 DIAGNOSIS — M5416 Radiculopathy, lumbar region: Secondary | ICD-10-CM

## 2022-12-16 NOTE — Therapy (Signed)
OUTPATIENT PHYSICAL THERAPY TREATMENT   Patient Name: Marc Fisher MRN: [id number] DOB:1975/08/05, 47 y.o., male Today's Date: 12/16/2022  END OF SESSION:  PT End of Session - 12/16/22 1534     Visit Number 5    Date for PT Re-Evaluation 01/27/23    Authorization Type MVA / State BCBS    PT Start Time 1534    PT Stop Time 1618    PT Time Calculation (min) 44 min    Activity Tolerance Patient tolerated treatment well    Behavior During Therapy WFL for tasks assessed/performed                Past Medical History:  Diagnosis Date   Depression    GERD (gastroesophageal reflux disease)    Gout    Pilonidal cyst    Sleep apnea    Thyroid disease    Past Surgical History:  Procedure Laterality Date   CYST EXCISION     face   INCISION AND DRAINAGE Left 06/13/2021   Procedure: INCISION AND DRAINAGE LEFT LONG FINGER;  Surgeon: Betha Loa, MD;  Location: [facility];  Service: Orthopedics;  Laterality: Left;   LITHOTRIPSY  02/20/2021   PILONIDAL CYST EXCISION     WISDOM TOOTH EXTRACTION     Patient Active Problem List   Diagnosis Date Noted   Lumbosacral strain 11/15/2022   History of gout 05/14/2018   Arthralgia 12/25/2017   Prediabetes 12/25/2017   Right distal ureteral calculus 08/21/2015   Hypothyroidism 07/21/2015   Hypogonadism in male 07/21/2015   Obesity 07/21/2015   Sleep apnea 07/21/2015    PCP: Darral Dash, DO   REFERRING PROVIDER: McDiarmid, Leighton Roach, MD   REFERRING DIAG: M54.50,G89.29 (ICD-10-CM) - Chronic bilateral low back pain without sciatica   THERAPY DIAG:  Other low back pain  Radiculopathy, lumbar region  Muscle spasm of back  Other abnormalities of gait and mobility  RATIONALE FOR EVALUATION AND TREATMENT: Rehabilitation  ONSET DATE: 05/17/22  NEXT MD VISIT: 12/24/22 with PCP   SUBJECTIVE:                                                                                                                                                                                                          SUBJECTIVE STATEMENT: Pt reports pain still mostly associated with muscle spasms when trying to reposition in bed.  PAIN: Are you having pain? Yes: NPRS scale: 0-1/10 Pain location: B thoracolumbar junction (mid/low back) Pain description: constant pressure, intermittent intense stabbing, numbness into B LE with prolonged sitting Aggravating factors:  random/unpredictable at times, transitional movements twisting, throwing motions Relieving factors: nothing, gabapentin  PERTINENT HISTORY:  Prediabetes, obesity hypothyroidism, arthralgia, history of gout, sleep apnea, depression, GERD  PRECAUTIONS: None  RED FLAGS: None  WEIGHT BEARING RESTRICTIONS: No  FALLS:  Has patient fallen in last 6 months? 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 Return to normal activities pain free."   OBJECTIVE: (objective measures completed at initial evaluation unless otherwise dated)  DIAGNOSTIC FINDINGS:  11/24/22 - DG Lumbar Spine Complete: FINDINGS: There is no evidence of lumbar spine fracture. Alignment is normal. Minimal osteoarthritic changes with mild disc space narrowing and anterior osteophyte formation. 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  PATIENT SURVEYS:  Modified Oswestry 21 / 50 = 42.0 %  FOTO Lumbar spine = 34, predicted = 55  SCREENING FOR RED FLAGS: Bowel or bladder incontinence: No Spinal tumors: No Cauda equina syndrome: No Compression fracture: No Abdominal aneurysm: No  COGNITION:  Overall cognitive status: Within functional limits for tasks assessed    SENSATION: WFL Pt reports intermittent  numbness into B LE with prolonged sitting  MUSCLE LENGTH: (unable to assess on eval due to movement intolerance) Hamstrings:  ITB:  Piriformis:  Hip flexors:  Quads:  Heelcord:   POSTURE:  decreased lumbar lordosis  PALPATION: Increased muscle tension/spasms in thoracolumbar paraspinals and QL - most tender at thoracolumbar junction  LUMBAR ROM:   Active  Eval   Flexion Hands to knees ^  Extension 50% limited ^  Right lateral flexion Unable ^  Left lateral flexion Hand to mid thigh ^  Right rotation Unable ^  Left rotation Unable ^  (Blank rows = not tested, ^ = increased pain)  LOWER EXTREMITY ROM:  (unable to assess on eval due to movement intolerance)  Active  Right eval Left eval  Hip flexion    Hip extension    Hip abduction    Hip adduction    Hip internal rotation    Hip external rotation    Knee flexion    Knee extension    Ankle dorsiflexion    Ankle plantarflexion    Ankle inversion    Ankle eversion    (Blank rows = not tested)  LOWER EXTREMITY MMT:  (tested in sitting)  MMT Right eval Left eval  Hip flexion 4+ 4+  Hip extension    Hip abduction 5 5  Hip adduction 4+ 4+  Hip internal rotation 5 5  Hip external rotation 4+ 5  Knee flexion 5 5  Knee extension 5 5  Ankle dorsiflexion 5 5  Ankle plantarflexion    Ankle inversion    Ankle eversion     (Blank rows = not tested)  LUMBAR SPECIAL TESTS:  Slump test: Negative   TODAY'S TREATMENT:   12/16/22 THERAPEUTIC EXERCISE: to improve flexibility, strength and mobility.  Demonstration, verbal and tactile cues throughout for technique.  Rec Bike - L2 x 6 min Seated 3-way lumbar flexion stretch with hands on green Pball 3 x 30" Seated R/L QL stretch 2 x 30"  Standing TrA + GTB row 10 x 3", 2 sets Standing TrA + GTB scap retraction/shoulder extension 10 x 3", 2 sets Standing GTB B pallof press 2 x 10 each Standing GTB short arc  trunk rotation 2 x 10 bil  SELF CARE:  Provided instruction  in self-STM to R flank/QL using FR on wall    12/12/22 THERAPEUTIC EXERCISE: to improve flexibility, strength and mobility.  Demonstration, verbal and tactile cues throughout for technique.  NuStep - L4 x 8 min Hooklying LTR x 10 each way Hooklying alt RTB bent-knee fallout x 10 Hooklying RTB brace marching x 10  MANUAL THERAPY: To promote normalized muscle tension, improved flexibility, and reduced pain. Skilled palpation and monitoring of soft tissue during DN Trigger Point Dry-Needling  Treatment instructions: Expect mild to moderate muscle soreness. S/S of pneumothorax if dry needled over a lung field, and to seek immediate medical attention should they occur. Patient verbalized understanding of these instructions and education. Patient Consent Given: Yes Education handout provided: Yes Muscles treated: B lumbar multifidi Electrical stimulation performed: No Parameters: N/A Treatment response/outcome: Twitch Response Elicited and Palpable Increase in Muscle Length STM/DTM, manual TPR and pin & stretch to muscles addressed with DN IASTM with foam roller to B thoracolumbar paraspinals and glutes/piriformis   12/09/22 Therapeutic Exercise: to improve strength and mobility.  Demo, verbal and tactile cues throughout for technique.  Nustep L4x9min Supine LTR x 10 each way Open book stretch x 10 R side Lumbar extensions at wall Seated lumbar extension x 10   Manual therapy: STM to R thoracolumbar paraspinals in S/L  Therapeutic Activity: Reviewed bed mobility techniques to decreased strain on LB, rolling in bed   PATIENT EDUCATION:  Education details: role of DN and DN rational, procedure, outcomes, potential side effects, and recommended post-treatment exercises/activity Person educated: Patient Education method: Explanation and Handouts Education comprehension: verbalized understanding  HOME EXERCISE PROGRAM: Access Code: [email or web address] URL:  [email or web address] Date: 12/16/2022 Prepared by: Glenetta Hew  Exercises - Supine Posterior Pelvic Tilt  - 1 x daily - 7 x weekly - 3 sets - 10 reps - 3 sec hold - Supine Lower Trunk Rotation  - 1 x daily - 7 x weekly - 3 sets - 10 reps - 5 sec hold - Bent Knee Fallouts  - 1 x daily - 7 x weekly - 3 sets - 10 reps - Supine March  - 1 x daily - 7 x weekly - 3 sets - 10 reps - Sidelying Thoracic Rotation with Open Book  - 1 x daily - 7 x weekly - 2 sets - 10 reps - 5 sec  hold - Seated Quadratus Lumborum Stretch in Chair  - 2 x daily - 7 x weekly - 3 reps - 30 sec hold - Standing Quadratus Lumborum Stretch with Doorway  - 2 x daily - 7 x weekly - 3 reps - 30 sec hold - Standing Shoulder Row with Anchored Resistance  - 1 x daily - 7 x weekly - 2 sets - 10 reps - 5 sec hold - Scapular Retraction with Resistance Advanced  - 1 x daily - 7 x weekly - 2 sets - 10 reps - 5 sec hold - Standing Anti-Rotation Press with Anchored Resistance  - 1 x daily - 7 x weekly - 2 sets - 10 reps - 3 sec hold   ASSESSMENT:  CLINICAL IMPRESSION: Joban reports he wasn't able to appreciate much difference following the DN last visit. Current pain mostly localized to R flank/QL. Introduced stretching and self-STM with foam roller to target this area and progressed strengthening to engage core and postural muscles with good tolerance and no increased pain. HEP updated to reflect exercise progression. Thereasa Distance  will benefit from continued skilled PT to address ongoing deficits to improve mobility and activity tolerance with decreased pain interference.   OBJECTIVE IMPAIRMENTS: Abnormal gait, decreased activity tolerance, decreased balance, decreased endurance, decreased knowledge of condition, decreased knowledge of use of DME, decreased mobility, difficulty walking, decreased ROM, decreased strength, hypomobility, increased fascial restrictions, impaired perceived functional ability, increased muscle spasms,  impaired flexibility, impaired sensation, improper body mechanics, postural dysfunction, and pain.   ACTIVITY LIMITATIONS: carrying, lifting, bending, sitting, standing, squatting, stairs, transfers, bed mobility, bathing, toileting, dressing, hygiene/grooming, locomotion level, and caring for others  PARTICIPATION LIMITATIONS: meal prep, cleaning, laundry, interpersonal relationship, driving, shopping, community activity, occupation, yard work, and school  PERSONAL FACTORS: Past/current experiences, Time since onset of injury/illness/exacerbation, and 3+ comorbidities: Prediabetes, obesity hypothyroidism, arthralgia, history of gout, sleep apnea, depression, GERD  are also affecting patient's functional outcome.   REHAB POTENTIAL: Good  CLINICAL DECISION MAKING: Evolving/moderate complexity  EVALUATION COMPLEXITY: Moderate   GOALS: Goals reviewed with patient? Yes  SHORT TERM GOALS: Target date: 12/30/2022  Patient will be independent with initial HEP to improve outcomes and carryover.  Baseline: TBD Goal status: IN PROGRESS  2.  Patient will report centralization of radicular symptoms.  Baseline: Intermittent B LE numbness typically with prolonged sitting (patient noted to sit at edge of chair with legs pressing down into edge of chair) Goal status: IN PROGRESS  12/16/22 - pain now localized to R flank/QL   LONG TERM GOALS: Target date: 01/27/2023   Patient will be independent with ongoing/advanced HEP for self-management at home.  Baseline:  Goal status: IN PROGRESS  2.  Patient will report at least 50-75% improvement in low back pain to improve QOL.  Baseline: at rest (sitting) 2/10; with movement up to 9-10/10 Goal status: IN PROGRESS  3.  Patient to demonstrate ability to achieve and maintain good spinal alignment/posturing and body mechanics needed for daily activities. Baseline:  Goal status: IN PROGRESS  4.  Patient will demonstrate functional pain free lumbar ROM to  perform ADLs.   Baseline: Refer to above lumbar ROM table  Goal status: IN PROGRESS  5.  Patient will demonstrate improved B LE strength to 5/5 for improved stability and ease of mobility . Baseline: Refer to above LE MMT table Goal status: IN PROGRESS  6.  Patient will report 71 on lumbar FOTO to demonstrate improved functional ability.  Baseline: FOTO Lumbar spine = 34 Goal status: IN PROGRESS   7. Patient will report </= 30% on Modified Oswestry to demonstrate improved functional ability with decreased pain interference. Baseline: Modified Oswestry 21 / 50 = 42.0 %  Goal status: IN PROGRESS  8.  Patient will tolerate transitional movements w/o increased pain to allow for improved mobility and activity tolerance. Baseline: pain up to 9/10 with most transitional movements  Goal status: IN PROGRESS  9.  Patient to report ability to perform ADLs, household, and work-related tasks without limitation due to low back pain, muscle spasm, LOM or weakness Baseline:  Goal status: IN PROGRESS   PLAN:  PT FREQUENCY: 2x/week  PT DURATION: 8 weeks  PLANNED INTERVENTIONS: Therapeutic exercises, Therapeutic activity, Neuromuscular re-education, Balance training, Gait training, Patient/Family education, Self Care, Joint mobilization, Aquatic Therapy, Dry Needling, Electrical stimulation, Spinal manipulation, Spinal mobilization, Cryotherapy, Moist heat, Taping, Vasopneumatic device, Traction, Ultrasound, Manual therapy, and Re-evaluation  PLAN FOR NEXT SESSION: Assess response to DN, gentle lumbopelvic flexibility, ROM and stabilization; posture and body mechanics education; MT +/- DN and modalities PRN for pain management  Marry Guan, PT 12/16/2022, 5:20 PM

## 2022-12-19 ENCOUNTER — Ambulatory Visit: Payer: BC Managed Care – PPO | Attending: Family Medicine | Admitting: Physical Therapy

## 2022-12-19 ENCOUNTER — Encounter: Payer: Self-pay | Admitting: Physical Therapy

## 2022-12-19 DIAGNOSIS — M62830 Muscle spasm of back: Secondary | ICD-10-CM | POA: Diagnosis present

## 2022-12-19 DIAGNOSIS — M5416 Radiculopathy, lumbar region: Secondary | ICD-10-CM

## 2022-12-19 DIAGNOSIS — R2689 Other abnormalities of gait and mobility: Secondary | ICD-10-CM

## 2022-12-19 DIAGNOSIS — M5459 Other low back pain: Secondary | ICD-10-CM | POA: Diagnosis present

## 2022-12-19 NOTE — Therapy (Signed)
OUTPATIENT PHYSICAL THERAPY TREATMENT   Patient Name: Marc Fisher MRN: [id number] DOB:19-Dec-1975, 47 y.o., male Today's Date: 12/19/2022  END OF SESSION:  PT End of Session - 12/19/22 1537     Visit Number 6    Date for PT Re-Evaluation 01/27/23    Authorization Type MVA / State BCBS    PT Start Time 1537    PT Stop Time 1623    PT Time Calculation (min) 46 min    Activity Tolerance Patient tolerated treatment well    Behavior During Therapy WFL for tasks assessed/performed                Past Medical History:  Diagnosis Date   Depression    GERD (gastroesophageal reflux disease)    Gout    Pilonidal cyst    Sleep apnea    Thyroid disease    Past Surgical History:  Procedure Laterality Date   CYST EXCISION     face   INCISION AND DRAINAGE Left 06/13/2021   Procedure: INCISION AND DRAINAGE LEFT LONG FINGER;  Surgeon: Betha Loa, MD;  Location: [facility];  Service: Orthopedics;  Laterality: Left;   LITHOTRIPSY  02/20/2021   PILONIDAL CYST EXCISION     WISDOM TOOTH EXTRACTION     Patient Active Problem List   Diagnosis Date Noted   Lumbosacral strain 11/15/2022   History of gout 05/14/2018   Arthralgia 12/25/2017   Prediabetes 12/25/2017   Right distal ureteral calculus 08/21/2015   Hypothyroidism 07/21/2015   Hypogonadism in male 07/21/2015   Obesity 07/21/2015   Sleep apnea 07/21/2015    PCP: Darral Dash, DO   REFERRING PROVIDER: McDiarmid, Leighton Roach, MD   REFERRING DIAG: M54.50,G89.29 (ICD-10-CM) - Chronic bilateral low back pain without sciatica   THERAPY DIAG:  Other low back pain  Radiculopathy, lumbar region  Muscle spasm of back  Other abnormalities of gait and mobility  RATIONALE FOR EVALUATION AND TREATMENT: Rehabilitation  ONSET DATE: 05/17/22  NEXT MD VISIT: 12/24/22 with PCP   SUBJECTIVE:                                                                                                                                                                                                          SUBJECTIVE STATEMENT: Pt reports no change with pain at night - difficulty getting into position but then does not typically move during the night. Slow to get up in the morning. No longer having the radicular numbness.  PAIN: Are you having pain? Yes: NPRS scale: 1/10 Pain location: B  thoracolumbar junction (mid/low back) Pain description: dull ache, tired feeling during the day; intense "catching" when trying to reposition at night Aggravating factors: random/unpredictable at times, transitional movements twisting, throwing motions Relieving factors: nothing, gabapentin  PERTINENT HISTORY:  Prediabetes, obesity hypothyroidism, arthralgia, history of gout, sleep apnea, depression, GERD  PRECAUTIONS: None  RED FLAGS: None  WEIGHT BEARING RESTRICTIONS: No  FALLS:  Has patient fallen in last 6 months? 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 Return to normal activities pain free."   OBJECTIVE: (objective measures completed at initial evaluation unless otherwise dated)  DIAGNOSTIC FINDINGS:  11/24/22 - DG Lumbar Spine Complete: FINDINGS: There is no evidence of lumbar spine fracture. Alignment is normal. Minimal osteoarthritic changes with mild disc space narrowing and anterior osteophyte formation. 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  PATIENT SURVEYS:  Modified Oswestry 21 / 50 = 42.0 %  FOTO Lumbar spine = 34, predicted = 55  SCREENING FOR RED FLAGS: Bowel or bladder incontinence: No Spinal tumors: No Cauda equina syndrome: No Compression fracture: No Abdominal aneurysm: No  COGNITION:  Overall  cognitive status: Within functional limits for tasks assessed    SENSATION: WFL Pt reports intermittent numbness into B LE with prolonged sitting  MUSCLE LENGTH: (unable to assess on eval due to movement intolerance) Hamstrings:  ITB:  Piriformis:  Hip flexors:  Quads:  Heelcord:   POSTURE:  decreased lumbar lordosis  PALPATION: Increased muscle tension/spasms in thoracolumbar paraspinals and QL - most tender at thoracolumbar junction  LUMBAR ROM:   Active  Eval   Flexion Hands to knees ^  Extension 50% limited ^  Right lateral flexion Unable ^  Left lateral flexion Hand to mid thigh ^  Right rotation Unable ^  Left rotation Unable ^  (Blank rows = not tested, ^ = increased pain)  LOWER EXTREMITY ROM:  (unable to assess on eval due to movement intolerance)  Active  Right eval Left eval  Hip flexion    Hip extension    Hip abduction    Hip adduction    Hip internal rotation    Hip external rotation    Knee flexion    Knee extension    Ankle dorsiflexion    Ankle plantarflexion    Ankle inversion    Ankle eversion    (Blank rows = not tested)  LOWER EXTREMITY MMT:  (tested in sitting)  MMT Right eval Left eval  Hip flexion 4+ 4+  Hip extension    Hip abduction 5 5  Hip adduction 4+ 4+  Hip internal rotation 5 5  Hip external rotation 4+ 5  Knee flexion 5 5  Knee extension 5 5  Ankle dorsiflexion 5 5  Ankle plantarflexion    Ankle inversion    Ankle eversion     (Blank rows = not tested)  LUMBAR SPECIAL TESTS:  Slump test: Negative   TODAY'S TREATMENT:   12/19/22 THERAPEUTIC EXERCISE: to improve flexibility, strength and mobility.  Demonstration, verbal and tactile cues throughout for technique.  Rec Bike - L4 x 6 min LTR with distal LE on orange ball x 10 DKTC with heels on orange ball x 10 Hooklying PPT 10 x 5" Hooklying PPT roll-up into bridge stopping shy of triggering "catch" x  10  MANUAL THERAPY: To promote normalized muscle tension,  improved flexibility, increased ROM, and reduced pain. Skilled palpation and monitoring of soft tissue during DN Trigger Point Dry-Needling  Treatment instructions: Expect mild to moderate muscle soreness. S/S of pneumothorax if dry needled over a lung field, and to seek immediate medical attention should they occur. Patient verbalized understanding of these instructions and education. Patient Consent Given: Yes Education handout provided: Previously provided Muscles treated: R QL & erector spinae Electrical stimulation performed: No Parameters: N/A Treatment response/outcome: Twitch Response Elicited and Palpable Increase in Muscle Length STM/DTM, manual TPR and pin & stretch to muscles addressed with DN  THERAPEUTIC ACTIVITIES: Guided patient in logrolling and side-lying to sit to minimize strain on low back - patient reporting it still does not feel quite normal but less discomfort than previously Provided education in proper posture and body mechanics for typical daily positioning and household chores to minimize strain on low back.   12/16/22 THERAPEUTIC EXERCISE: to improve flexibility, strength and mobility.  Demonstration, verbal and tactile cues throughout for technique.  Rec Bike - L2 x 6 min Seated 3-way lumbar flexion stretch with hands on green Pball 3 x 30" Seated R/L QL stretch 2 x 30"  Standing TrA + GTB row 10 x 3", 2 sets Standing TrA + GTB scap retraction/shoulder extension 10 x 3", 2 sets Standing GTB B pallof press 2 x 10 each Standing GTB short arc trunk rotation 2 x 10 bil  SELF CARE:  Provided instruction in self-STM to R flank/QL using FR on wall    12/12/22 THERAPEUTIC EXERCISE: to improve flexibility, strength and mobility.  Demonstration, verbal and tactile cues throughout for technique.  NuStep - L4 x 8 min Hooklying LTR x 10 each way Hooklying alt RTB bent-knee fallout x 10 Hooklying RTB brace marching x 10  MANUAL THERAPY: To promote normalized  muscle tension, improved flexibility, and reduced pain. Skilled palpation and monitoring of soft tissue during DN Trigger Point Dry-Needling  Treatment instructions: Expect mild to moderate muscle soreness. S/S of pneumothorax if dry needled over a lung field, and to seek immediate medical attention should they occur. Patient verbalized understanding of these instructions and education. Patient Consent Given: Yes Education handout provided: Yes Muscles treated: B lumbar multifidi Electrical stimulation performed: No Parameters: N/A Treatment response/outcome: Twitch Response Elicited and Palpable Increase in Muscle Length STM/DTM, manual TPR and pin & stretch to muscles addressed with DN IASTM with foam roller to B thoracolumbar paraspinals and glutes/piriformis   PATIENT EDUCATION:  Education details: posture and body mechanics for typical daily postioning, mobility and household tasks, role of DN, and DN rational, procedure, outcomes, potential side effects, and recommended post-treatment exercises/activity Person educated: Patient Education method: Explanation and Handouts Education comprehension: verbalized understanding  HOME EXERCISE PROGRAM: Access Code: [email or web address] URL: [email or web address] Date: 12/19/2022 Prepared by: Glenetta Hew  Exercises - Supine Posterior Pelvic Tilt  - 1 x daily - 7 x weekly - 3 sets - 10 reps - 3 sec hold - Supine Lower Trunk Rotation  - 1 x daily - 7 x weekly - 3 sets - 10 reps - 5 sec hold - Bent Knee Fallouts  - 1 x daily - 7 x weekly - 3 sets - 10 reps - Supine March  - 1 x daily - 7 x weekly - 3 sets - 10 reps - Sidelying Thoracic Rotation with Open Book  - 1 x daily - 7 x weekly - 2 sets - 10 reps -  5 sec  hold - Seated Quadratus Lumborum Stretch in Chair  - 2 x daily - 7 x weekly - 3 reps - 30 sec hold - Standing Quadratus Lumborum Stretch with Doorway  - 2 x daily - 7 x weekly - 3 reps - 30 sec hold - Standing Shoulder Row with  Anchored Resistance  - 1 x daily - 7 x weekly - 2 sets - 10 reps - 5 sec hold - Scapular Retraction with Resistance Advanced  - 1 x daily - 7 x weekly - 2 sets - 10 reps - 5 sec hold - Standing Anti-Rotation Press with Anchored Resistance  - 1 x daily - 7 x weekly - 2 sets - 10 reps - 3 sec hold  Patient Education - Posture and Body Mechanics   ASSESSMENT:  CLINICAL IMPRESSION: Marc Fisher reports his pain remains minimal during the day with no further radicular numbness but still has intermittent "catching" pain at night in bed when he tries to change position or go to get up.  He has been able to wean from all the meds except an occasional Tylenol.  Current pain mostly localized to R flank/QL with continued increased muscle tension/taut bands still evident, which were addressed with MT incorporating DN to R QL and erector spinae.  Good twitch responses elicited resulting in palpable reduction in muscle tension with resolution of TTP, however patient still noting some degree of "catch" when attempting to roll over although able to lift further into a bridging motion without triggering the catch.  Session concluded with instruction in proper posture and body mechanics for typical daily positioning and household chores to minimize strain on low back.  Marc Fisher is progressing well towards his PT goals with STG's now met and will benefit from continued skilled PT to address ongoing deficits to improve mobility and activity tolerance with decreased pain interference.   OBJECTIVE IMPAIRMENTS: Abnormal gait, decreased activity tolerance, decreased balance, decreased endurance, decreased knowledge of condition, decreased knowledge of use of DME, decreased mobility, difficulty walking, decreased ROM, decreased strength, hypomobility, increased fascial restrictions, impaired perceived functional ability, increased muscle spasms, impaired flexibility, impaired sensation, improper body mechanics, postural dysfunction,  and pain.   ACTIVITY LIMITATIONS: carrying, lifting, bending, sitting, standing, squatting, stairs, transfers, bed mobility, bathing, toileting, dressing, hygiene/grooming, locomotion level, and caring for others  PARTICIPATION LIMITATIONS: meal prep, cleaning, laundry, interpersonal relationship, driving, shopping, community activity, occupation, yard work, and school  PERSONAL FACTORS: Past/current experiences, Time since onset of injury/illness/exacerbation, and 3+ comorbidities: Prediabetes, obesity hypothyroidism, arthralgia, history of gout, sleep apnea, depression, GERD  are also affecting patient's functional outcome.   REHAB POTENTIAL: Good  CLINICAL DECISION MAKING: Evolving/moderate complexity  EVALUATION COMPLEXITY: Moderate   GOALS: Goals reviewed with patient? Yes  SHORT TERM GOALS: Target date: 12/30/2022  Patient will be independent with initial HEP to improve outcomes and carryover.  Baseline: TBD Goal status: MET  12/19/22  2.  Patient will report centralization of radicular symptoms.  Baseline: Intermittent B LE numbness typically with prolonged sitting (patient noted to sit at edge of chair with legs pressing down into edge of chair) Goal status: MET  12/19/22 - pain now localized to R flank/QL, with no radicular numbness  LONG TERM GOALS: Target date: 01/27/2023   Patient will be independent with ongoing/advanced HEP for self-management at home.  Baseline:  Goal status: IN PROGRESS  2.  Patient will report at least 50-75% improvement in low back pain to improve QOL.  Baseline: at rest (sitting)  2/10; with movement up to 9-10/10 Goal status: IN PROGRESS  3.  Patient to demonstrate ability to achieve and maintain good spinal alignment/posturing and body mechanics needed for daily activities. Baseline:  Goal status: IN PROGRESS  12/19/22 - Education provided today  4.  Patient will demonstrate functional pain free lumbar ROM to perform ADLs.   Baseline: Refer  to above lumbar ROM table  Goal status: IN PROGRESS  5.  Patient will demonstrate improved B LE strength to 5/5 for improved stability and ease of mobility . Baseline: Refer to above LE MMT table Goal status: IN PROGRESS  6.  Patient will report 78 on lumbar FOTO to demonstrate improved functional ability.  Baseline: FOTO Lumbar spine = 34 Goal status: IN PROGRESS   7. Patient will report </= 30% on Modified Oswestry to demonstrate improved functional ability with decreased pain interference. Baseline: Modified Oswestry 21 / 50 = 42.0 %  Goal status: IN PROGRESS  8.  Patient will tolerate transitional movements w/o increased pain to allow for improved mobility and activity tolerance. Baseline: pain up to 9/10 with most transitional movements  Goal status: IN PROGRESS  9.  Patient to report ability to perform ADLs, household, and work-related tasks without limitation due to low back pain, muscle spasm, LOM or weakness Baseline:  Goal status: IN PROGRESS   PLAN:  PT FREQUENCY: 2x/week  PT DURATION: 8 weeks  PLANNED INTERVENTIONS: Therapeutic exercises, Therapeutic activity, Neuromuscular re-education, Balance training, Gait training, Patient/Family education, Self Care, Joint mobilization, Aquatic Therapy, Dry Needling, Electrical stimulation, Spinal manipulation, Spinal mobilization, Cryotherapy, Moist heat, Taping, Vasopneumatic device, Traction, Ultrasound, Manual therapy, and Re-evaluation  PLAN FOR NEXT SESSION: Assess response to DN; gentle lumbopelvic flexibility, ROM and stabilization; MT +/- DN and modalities PRN for pain management; review posture and body mechanics education PRN   Marry Guan, PT 12/19/2022, 4:30 PM

## 2022-12-23 ENCOUNTER — Ambulatory Visit: Payer: BC Managed Care – PPO | Admitting: Physical Therapy

## 2022-12-23 ENCOUNTER — Encounter: Payer: Self-pay | Admitting: Physical Therapy

## 2022-12-23 DIAGNOSIS — M5416 Radiculopathy, lumbar region: Secondary | ICD-10-CM

## 2022-12-23 DIAGNOSIS — M5459 Other low back pain: Secondary | ICD-10-CM | POA: Diagnosis not present

## 2022-12-23 DIAGNOSIS — R2689 Other abnormalities of gait and mobility: Secondary | ICD-10-CM

## 2022-12-23 DIAGNOSIS — M62830 Muscle spasm of back: Secondary | ICD-10-CM

## 2022-12-23 NOTE — Therapy (Signed)
OUTPATIENT PHYSICAL THERAPY TREATMENT   Patient Name: Marc Fisher MRN: [id number] DOB:1975-04-20, 47 y.o., male Today's Date: 12/23/2022  END OF SESSION:  PT End of Session - 12/23/22 1617     Visit Number 7    Date for PT Re-Evaluation 01/27/23    Authorization Type MVA / State BCBS    PT Start Time 1617    PT Stop Time 1658    PT Time Calculation (min) 41 min    Activity Tolerance Patient tolerated treatment well    Behavior During Therapy WFL for tasks assessed/performed                 Past Medical History:  Diagnosis Date   Depression    GERD (gastroesophageal reflux disease)    Gout    Pilonidal cyst    Sleep apnea    Thyroid disease    Past Surgical History:  Procedure Laterality Date   CYST EXCISION     face   INCISION AND DRAINAGE Left 06/13/2021   Procedure: INCISION AND DRAINAGE LEFT LONG FINGER;  Surgeon: Betha Loa, MD;  Location: [facility];  Service: Orthopedics;  Laterality: Left;   LITHOTRIPSY  02/20/2021   PILONIDAL CYST EXCISION     WISDOM TOOTH EXTRACTION     Patient Active Problem List   Diagnosis Date Noted   Lumbosacral strain 11/15/2022   History of gout 05/14/2018   Arthralgia 12/25/2017   Prediabetes 12/25/2017   Right distal ureteral calculus 08/21/2015   Hypothyroidism 07/21/2015   Hypogonadism in male 07/21/2015   Obesity 07/21/2015   Sleep apnea 07/21/2015    PCP: Darral Dash, DO   REFERRING PROVIDER: McDiarmid, Leighton Roach, MD   REFERRING DIAG: M54.50,G89.29 (ICD-10-CM) - Chronic bilateral low back pain without sciatica   THERAPY DIAG:  Other low back pain  Radiculopathy, lumbar region  Muscle spasm of back  Other abnormalities of gait and mobility  RATIONALE FOR EVALUATION AND TREATMENT: Rehabilitation  ONSET DATE: 05/17/22  NEXT MD VISIT: 12/24/22 with PCP   SUBJECTIVE:                                                                                                                                                                                                          SUBJECTIVE STATEMENT: Pt reports the R side is good now, but he has been having some radicular pain in his L side today - L upper buttock and anterior thigh. He thinks there has been less pain with repositioning in bed.  PAIN: Are you having pain? Yes: NPRS  scale: 1/10 Pain location: L upper buttock and anterior thigh Pain description: dull ache, tired feeling during the day; intense "catching" when trying to reposition at night Aggravating factors: random/unpredictable at times, transitional movements twisting, throwing motions Relieving factors: nothing, gabapentin  PERTINENT HISTORY:  Prediabetes, obesity hypothyroidism, arthralgia, history of gout, sleep apnea, depression, GERD  PRECAUTIONS: None  RED FLAGS: None  WEIGHT BEARING RESTRICTIONS: No  FALLS:  Has patient fallen in last 6 months? 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 Return to normal activities pain free."   OBJECTIVE: (objective measures completed at initial evaluation unless otherwise dated)  DIAGNOSTIC FINDINGS:  11/24/22 - DG Lumbar Spine Complete: FINDINGS: There is no evidence of lumbar spine fracture. Alignment is normal. Minimal osteoarthritic changes with mild disc space narrowing and anterior osteophyte formation. 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  PATIENT SURVEYS:  Modified Oswestry 21 / 50 = 42.0 %  FOTO Lumbar spine = 34, predicted = 55  SCREENING FOR RED FLAGS: Bowel or bladder incontinence: No Spinal tumors: No Cauda equina syndrome: No Compression fracture: No Abdominal aneurysm: No  COGNITION:  Overall  cognitive status: Within functional limits for tasks assessed    SENSATION: WFL Pt reports intermittent numbness into B LE with prolonged sitting  MUSCLE LENGTH: (unable to assess on eval due to movement intolerance) Hamstrings:  ITB:  Piriformis:  Hip flexors:  Quads:  Heelcord:   POSTURE:  decreased lumbar lordosis  PALPATION: Increased muscle tension/spasms in thoracolumbar paraspinals and QL - most tender at thoracolumbar junction  LUMBAR ROM:   Active  Eval   Flexion Hands to knees ^  Extension 50% limited ^  Right lateral flexion Unable ^  Left lateral flexion Hand to mid thigh ^  Right rotation Unable ^  Left rotation Unable ^  (Blank rows = not tested, ^ = increased pain)  LOWER EXTREMITY ROM:  (unable to assess on eval due to movement intolerance)  Active  Right eval Left eval  Hip flexion    Hip extension    Hip abduction    Hip adduction    Hip internal rotation    Hip external rotation    Knee flexion    Knee extension    Ankle dorsiflexion    Ankle plantarflexion    Ankle inversion    Ankle eversion    (Blank rows = not tested)  LOWER EXTREMITY MMT:  (tested in sitting)  MMT Right eval Left eval  Hip flexion 4+ 4+  Hip extension    Hip abduction 5 5  Hip adduction 4+ 4+  Hip internal rotation 5 5  Hip external rotation 4+ 5  Knee flexion 5 5  Knee extension 5 5  Ankle dorsiflexion 5 5  Ankle plantarflexion    Ankle inversion    Ankle eversion     (Blank rows = not tested)  LUMBAR SPECIAL TESTS:  Slump test: Negative   TODAY'S TREATMENT:   12/23/22 THERAPEUTIC EXERCISE: to improve flexibility, strength and mobility.  Demonstration, verbal and tactile cues throughout for technique.  Rec Bike - L4 x 7 min L KTOS glute/piriformis stretch with towel assist 3 x 30" Bridge + GTB hip ABD/ER clam 11 x 3-5" S/L L GTB clam 10 x 3"  MANUAL THERAPY: To promote normalized muscle tension, improved flexibility, improved joint mobility, and  reduced pain. Skilled palpation and monitoring of soft tissue during DN Trigger Point Dry-Needling  Treatment instructions: Expect mild to moderate muscle soreness. Patient verbalized understanding of these instructions and education. Patient Consent Given: Yes Education handout provided: No Muscles treated: L lateral glute medius  Electrical stimulation performed: No Parameters: N/A Treatment response/outcome: Twitch Response Elicited and Palpable Increase in Muscle Length STM/DTM, manual TPR and pin & stretch to muscles addressed with DN   12/19/22 THERAPEUTIC EXERCISE: to improve flexibility, strength and mobility.  Demonstration, verbal and tactile cues throughout for technique.  Rec Bike - L4 x 6 min LTR with distal LE on orange ball x 10 DKTC with heels on orange ball x 10 Hooklying PPT 10 x 5" Hooklying PPT roll-up into bridge stopping shy of triggering "catch" x 10  MANUAL THERAPY: To promote normalized muscle tension, improved flexibility, increased ROM, and reduced pain. Skilled palpation and monitoring of soft tissue during DN Trigger Point Dry-Needling  Treatment instructions: Expect mild to moderate muscle soreness. S/S of pneumothorax if dry needled over a lung field, and to seek immediate medical attention should they occur. Patient verbalized understanding of these instructions and education. Patient Consent Given: Yes Education handout provided: Previously provided Muscles treated: R QL & erector spinae Electrical stimulation performed: No Parameters: N/A Treatment response/outcome: Twitch Response Elicited and Palpable Increase in Muscle Length STM/DTM, manual TPR and pin & stretch to muscles addressed with DN  THERAPEUTIC ACTIVITIES: Guided patient in logrolling and side-lying to sit to minimize strain on low back - patient reporting it still does not feel quite normal but less discomfort than previously Provided education in proper posture and body mechanics for  typical daily positioning and household chores to minimize strain on low back.   12/16/22 THERAPEUTIC EXERCISE: to improve flexibility, strength and mobility.  Demonstration, verbal and tactile cues throughout for technique.  Rec Bike - L2 x 6 min Seated 3-way lumbar flexion stretch with hands on green Pball 3 x 30" Seated R/L QL stretch 2 x 30"  Standing TrA + GTB row 10 x 3", 2 sets Standing TrA + GTB scap retraction/shoulder extension 10 x 3", 2 sets Standing GTB B pallof press 2 x 10 each Standing GTB short arc trunk rotation 2 x 10 bil  SELF CARE:  Provided instruction in self-STM to R flank/QL using FR on wall    PATIENT EDUCATION:  Education details: HEP update - Side-sitting piriformis stretch, Bridge + GTB hip ABD, and S/l GTB clam, role of DN, and DN rational, procedure, outcomes, potential side effects, and recommended post-treatment exercises/activity Person educated: Patient Education method: Explanation, Demonstration, and Verbal cues Education comprehension: verbalized understanding and returned demonstration  HOME EXERCISE PROGRAM: Access Code: [email or web address] URL: [email or web address] Date: 12/23/2022 Prepared by: Glenetta Hew  Exercises - Supine Posterior Pelvic Tilt  - 1 x daily - 7 x weekly - 3 sets - 10 reps - 3 sec hold - Supine Lower Trunk Rotation  - 1 x daily - 7 x weekly - 3 sets - 10 reps - 5 sec hold - Bent Knee Fallouts  - 1 x daily - 7 x weekly - 3 sets - 10 reps - Supine March  - 1 x daily - 7 x weekly - 3 sets - 10 reps - Sidelying Thoracic Rotation with Open Book  - 1 x daily - 7 x weekly - 2 sets - 10 reps - 5 sec  hold - Seated Quadratus Lumborum Stretch in Chair  - 2 x daily - 7 x weekly - 3 reps - 30 sec hold - Standing Quadratus Lumborum Stretch with Doorway  - 2 x daily - 7 x weekly - 3 reps - 30 sec hold - Standing Shoulder Row with Anchored Resistance  - 1 x daily - 7 x weekly - 2 sets - 10 reps - 5 sec hold - Scapular Retraction  with Resistance Advanced  - 1 x daily - 7 x weekly - 2 sets - 10 reps - 5 sec hold - Standing Anti-Rotation Press with Anchored Resistance  - 1 x daily - 7 x weekly - 2 sets - 10 reps - 3 sec hold *These exercises added 12/23/22, but MedBridge not able to save changes or print: - Seated Table Piriformis Stretch - 2 x daily - 7 x weekly - 3 reps - 30 sec hold - Bridge with Hip Abduction and Resistance - 1 x daily - 3 x weekly - 2 sets - 10 reps - 5 sec hold - Clam with Resistance (Mirrored) - 1 x daily - 3 x weekly - 2 sets - 10 reps - 3-5 sec hold  Patient Education - Posture and Body Mechanics   ASSESSMENT:  CLINICAL IMPRESSION: Marc Fisher reports less pain with repositioning in bed and states the R-sided pain seems to have resolved but notes L-sided upper buttock pain with radicular pain into anterior thigh. Palpation revealing very tender TP in L lateral glutes - address with MT incorporating DN with good twitch response elicited resulting in palpable reduction in muscle tension and resolution of his pain. Proceeded to promote further normalization of muscle tension with stretches for glutes/piriformis followed by strengthening with pt able to perform a bridge w/o increased pain for the first time since the onset of his LBP. He will be traveling to [place] the end of this week and will return to PT in 10 days. If pain remains well controlled he would like to consider beginning transition to a gym program and in conjunction with his HEP.  Marc Fisher is progressing well towards his PT goals and will benefit from continued skilled PT to address ongoing deficits to improve mobility and activity tolerance with decreased pain interference.   OBJECTIVE IMPAIRMENTS: Abnormal gait, decreased activity tolerance, decreased balance, decreased endurance, decreased knowledge of condition, decreased knowledge of use of DME, decreased mobility, difficulty walking, decreased ROM, decreased strength, hypomobility,  increased fascial restrictions, impaired perceived functional ability, increased muscle spasms, impaired flexibility, impaired sensation, improper body mechanics, postural dysfunction, and pain.   ACTIVITY LIMITATIONS: carrying, lifting, bending, sitting, standing, squatting, stairs, transfers, bed mobility, bathing, toileting, dressing, hygiene/grooming, locomotion level, and caring for others  PARTICIPATION LIMITATIONS: meal prep, cleaning, laundry, interpersonal relationship, driving, shopping, community activity, occupation, yard work, and school  PERSONAL FACTORS: Past/current experiences, Time since onset of injury/illness/exacerbation, and 3+ comorbidities: Prediabetes, obesity hypothyroidism, arthralgia, history of gout, sleep apnea, depression, GERD  are also affecting patient's functional outcome.   REHAB POTENTIAL: Good  CLINICAL DECISION MAKING: Evolving/moderate complexity  EVALUATION COMPLEXITY: Moderate   GOALS: Goals reviewed with patient? Yes  SHORT TERM GOALS: Target date: 12/30/2022  Patient will be independent with initial HEP to improve outcomes and carryover.  Baseline: TBD Goal status: MET - 12/19/22  2.  Patient will report centralization of radicular symptoms.  Baseline: Intermittent B LE numbness typically with prolonged sitting (patient noted to sit at edge of chair with legs pressing down into edge of  chair) Goal status: MET - 12/19/22 - pain now localized to R flank/QL, with no radicular numbness  LONG TERM GOALS: Target date: 01/27/2023   Patient will be independent with ongoing/advanced HEP for self-management at home.  Baseline:  Goal status: IN PROGRESS - 12/23/22 - Met for current HEP; updated today  2.  Patient will report at least 50-75% improvement in low back pain to improve QOL.  Baseline: at rest (sitting) 2/10; with movement up to 9-10/10 Goal status: IN PROGRESS  3.  Patient to demonstrate ability to achieve and maintain good spinal  alignment/posturing and body mechanics needed for daily activities. Baseline:  Goal status: IN PROGRESS  12/19/22 - Education provided today  4.  Patient will demonstrate functional pain free lumbar ROM to perform ADLs.   Baseline: Refer to above lumbar ROM table  Goal status: IN PROGRESS  5.  Patient will demonstrate improved B LE strength to 5/5 for improved stability and ease of mobility . Baseline: Refer to above LE MMT table Goal status: IN PROGRESS  6.  Patient will report 55 on lumbar FOTO to demonstrate improved functional ability.  Baseline: FOTO Lumbar spine = 34 Goal status: IN PROGRESS   7. Patient will report </= 30% on Modified Oswestry to demonstrate improved functional ability with decreased pain interference. Baseline: Modified Oswestry 21 / 50 = 42.0 %  Goal status: IN PROGRESS  8.  Patient will tolerate transitional movements w/o increased pain to allow for improved mobility and activity tolerance. Baseline: pain up to 9/10 with most transitional movements  Goal status: IN PROGRESS - 12/23/22 - improving tolerance for repositioning in bed and supine to sit w/o increased pain  9.  Patient to report ability to perform ADLs, household, and work-related tasks without limitation due to low back pain, muscle spasm, LOM or weakness Baseline:  Goal status: IN PROGRESS   PLAN:  PT FREQUENCY: 2x/week  PT DURATION: 8 weeks  PLANNED INTERVENTIONS: Therapeutic exercises, Therapeutic activity, Neuromuscular re-education, Balance training, Gait training, Patient/Family education, Self Care, Joint mobilization, Aquatic Therapy, Dry Needling, Electrical stimulation, Spinal manipulation, Spinal mobilization, Cryotherapy, Moist heat, Taping, Vasopneumatic device, Traction, Ultrasound, Manual therapy, and Re-evaluation  PLAN FOR NEXT SESSION: Goal assessment; assess response to traveling; gentle lumbopelvic flexibility, ROM and stabilization; MT +/- DN and modalities PRN for pain  management; review posture and body mechanics education PRN   Marry Guan, PT 12/23/2022, 5:23 PM

## 2022-12-24 ENCOUNTER — Ambulatory Visit (INDEPENDENT_AMBULATORY_CARE_PROVIDER_SITE_OTHER): Payer: BC Managed Care – PPO | Admitting: Student

## 2022-12-24 ENCOUNTER — Encounter: Payer: Self-pay | Admitting: Student

## 2022-12-24 VITALS — BP 115/74 | HR 83 | Ht 68.5 in | Wt 294.0 lb

## 2022-12-24 DIAGNOSIS — R5383 Other fatigue: Secondary | ICD-10-CM

## 2022-12-24 DIAGNOSIS — E119 Type 2 diabetes mellitus without complications: Secondary | ICD-10-CM

## 2022-12-24 DIAGNOSIS — E039 Hypothyroidism, unspecified: Secondary | ICD-10-CM

## 2022-12-24 DIAGNOSIS — L649 Androgenic alopecia, unspecified: Secondary | ICD-10-CM | POA: Diagnosis not present

## 2022-12-24 DIAGNOSIS — O26819 Pregnancy related exhaustion and fatigue, unspecified trimester: Secondary | ICD-10-CM

## 2022-12-24 DIAGNOSIS — Z7985 Long-term (current) use of injectable non-insulin antidiabetic drugs: Secondary | ICD-10-CM

## 2022-12-24 DIAGNOSIS — R7303 Prediabetes: Secondary | ICD-10-CM

## 2022-12-24 LAB — POCT GLYCOSYLATED HEMOGLOBIN (HGB A1C): HbA1c, POC (controlled diabetic range): 6.2 % (ref 0.0–7.0)

## 2022-12-24 MED ORDER — MINOXIDIL FOR MEN 2 % EX SOLN: Freq: Two times a day (BID) | CUTANEOUS | 0 refills | Status: DC

## 2022-12-24 MED ORDER — SEMAGLUTIDE(0.25 OR 0.5MG/DOS) 2 MG/1.5ML ~~LOC~~ SOPN: 0.2500 mg | PEN_INJECTOR | SUBCUTANEOUS | 3 refills | Status: DC

## 2022-12-24 NOTE — Progress Notes (Signed)
    SUBJECTIVE:   CHIEF COMPLAINT / HPI:   Marc Fisher is a 47 year old male here with to discuss following concerns::  He recently saw Dr. Hyacinth Meeker for back pain.  He completed PT and he is so much better.  He says that he was having difficulty walking for quite a while, but that he feels great and he has been able to get back in the gym. He and his husband are doing well.   Hair thinning/hair loss He wanted to know if he could try minoxidil, topical or oral Mostly has hair thinning and loss in a male pattern baldness  Hypothyroidism Takes Armour Thyroid 120 mg daily, per patient preference Due for TSH Requested referral to endocrinology today  Type 2 diabetes BMI 44.05 Wanted to talk about options for lowering A1c and losing weight Currently working on nutrition and exercise He is starting going back to the gym Has read a little bit about Contrave.  We discussed that is difficult to get insurance coverage for this.  Also knows a little bit about Ozempic.   PERTINENT  PMH / PSH: Reviewed  OBJECTIVE:   BP 115/74   Pulse 83   Ht 5' 8.5" (1.74 m)   Wt 294 lb (133.4 kg)   SpO2 97%   BMI 44.05 kg/m   General: Pleasant, well-appearing, no distress Cardiac: Regular rate and rhythm Respiratory: Normal work of breathing on room air.  No wheezing or crackles. Extremities: Moves all equally, no rashes or lesions on exposed skin   ASSESSMENT/PLAN:   Hypothyroidism TSH within normal limits Continue current Armour Thyroid dose Referral to endocrinology per patient request  Male pattern baldness Prescribed topical minoxidil today Discussed that I will take 8 weeks to start to see effect  Type 2 diabetes mellitus without complications (HCC) Last A1c was 6.3, today 6.2%. Would benefit from GLP-1 given BMI 44.05. Opted for semaglutide 0.25 mg weekly, await insurance authorization.  Encouraged to continue going to the gym and working on diet.   Next A1c expected January  2024      Darral Dash, DO [facility]

## 2022-12-24 NOTE — Patient Instructions (Addendum)
So great to see you today.  I sent in semaglutide (Ozempic) to your pharmacy.  We would need to await insurance authorization before this will be filled. In the meantime, continue to try to incorporate more activity into your day/go to the gym a few times a week. Diet is very important-reducing fried foods, simple carbohydrates (white bread, white pasta), and sugar laden beverages will help lower your A1c.  We are checking blood work today.  I will call you if anything is abnormal.   If you have any questions or concerns, please feel free to call the clinic.   Have a wonderful day,  Dr. Darral Dash [facility] [phone]

## 2022-12-25 LAB — VITAMIN B12: Vitamin B-12: 894 pg/mL (ref 232–1245)

## 2022-12-25 LAB — VITAMIN D 25 HYDROXY (VIT D DEFICIENCY, FRACTURES): Vit D, 25-Hydroxy: 42.6 ng/mL (ref 30.0–100.0)

## 2022-12-25 LAB — T3, FREE: T3, Free: 4 pg/mL (ref 2.0–4.4)

## 2022-12-25 LAB — TSH RFX ON ABNORMAL TO FREE T4: TSH: 1.45 u[IU]/mL (ref 0.450–4.500)

## 2022-12-27 ENCOUNTER — Encounter: Payer: Self-pay | Admitting: Student

## 2022-12-27 DIAGNOSIS — E119 Type 2 diabetes mellitus without complications: Secondary | ICD-10-CM | POA: Insufficient documentation

## 2022-12-27 DIAGNOSIS — L649 Androgenic alopecia, unspecified: Secondary | ICD-10-CM | POA: Insufficient documentation

## 2022-12-27 DIAGNOSIS — R7303 Prediabetes: Secondary | ICD-10-CM | POA: Insufficient documentation

## 2022-12-27 NOTE — Assessment & Plan Note (Signed)
TSH within normal limits Continue current Armour Thyroid dose Referral to endocrinology per patient request

## 2022-12-27 NOTE — Assessment & Plan Note (Signed)
Last A1c was 6.3, today 6.2%. Would benefit from GLP-1 given BMI 44.05. Opted for semaglutide 0.25 mg weekly, await insurance authorization.  Encouraged to continue going to the gym and working on diet.   Next A1c expected January 2024

## 2022-12-27 NOTE — Assessment & Plan Note (Signed)
Prescribed topical minoxidil today Discussed that I will take 8 weeks to start to see effect

## 2022-12-30 ENCOUNTER — Ambulatory Visit: Payer: BC Managed Care – PPO

## 2023-01-01 ENCOUNTER — Encounter: Payer: Self-pay | Admitting: Student

## 2023-01-02 ENCOUNTER — Ambulatory Visit: Payer: BC Managed Care – PPO | Admitting: Physical Therapy

## 2023-01-02 ENCOUNTER — Other Ambulatory Visit (HOSPITAL_COMMUNITY): Payer: Self-pay

## 2023-01-02 ENCOUNTER — Encounter: Payer: Self-pay | Admitting: Physical Therapy

## 2023-01-02 ENCOUNTER — Telehealth: Payer: Self-pay

## 2023-01-02 DIAGNOSIS — M5459 Other low back pain: Secondary | ICD-10-CM | POA: Diagnosis not present

## 2023-01-02 DIAGNOSIS — R2689 Other abnormalities of gait and mobility: Secondary | ICD-10-CM

## 2023-01-02 DIAGNOSIS — M5416 Radiculopathy, lumbar region: Secondary | ICD-10-CM

## 2023-01-02 DIAGNOSIS — M62830 Muscle spasm of back: Secondary | ICD-10-CM

## 2023-01-02 NOTE — Therapy (Signed)
OUTPATIENT PHYSICAL THERAPY TREATMENT  Progress Note  Reporting Period 12/02/2022 to 01/02/2023   See note below for Objective Data and Assessment of Progress/Goals.     Patient Name: Marc Fisher MRN: [id number] DOB:1975-09-17, 47 y.o., male Today's Date: 01/02/2023  END OF SESSION:  PT End of Session - 01/02/23 1624     Visit Number 8    Date for PT Re-Evaluation 01/27/23    Authorization Type MVA / State BCBS    PT Start Time 1624    PT Stop Time 1656    PT Time Calculation (min) 32 min    Activity Tolerance Patient tolerated treatment well    Behavior During Therapy [facility] for tasks assessed/performed                  Past Medical History:  Diagnosis Date   Arthralgia 12/25/2017   Depression    GERD (gastroesophageal reflux disease)    Gout    Lumbosacral strain 11/15/2022   Pilonidal cyst    Right distal ureteral calculus 08/21/2015   Sleep apnea    Thyroid disease    Past Surgical History:  Procedure Laterality Date   CYST EXCISION     face   INCISION AND DRAINAGE Left 06/13/2021   Procedure: INCISION AND DRAINAGE LEFT LONG FINGER;  Surgeon: Betha Loa, MD;  Location: [facility];  Service: Orthopedics;  Laterality: Left;   LITHOTRIPSY  02/20/2021   PILONIDAL CYST EXCISION     WISDOM TOOTH EXTRACTION     Patient Active Problem List   Diagnosis Date Noted   Type 2 diabetes mellitus without complications (HCC) 12/27/2022   Male pattern baldness 12/27/2022   History of gout 05/14/2018   Hypothyroidism 07/21/2015   Obesity 07/21/2015   Sleep apnea 07/21/2015    PCP: Darral Dash, DO   REFERRING PROVIDER: McDiarmid, Leighton Roach, MD   REFERRING DIAG: M54.50,G89.29 (ICD-10-CM) - Chronic bilateral low back pain without sciatica   THERAPY DIAG:  Other low back pain  Radiculopathy, lumbar region  Muscle spasm of back  Other abnormalities of gait and mobility  RATIONALE FOR EVALUATION AND TREATMENT: Rehabilitation  ONSET  DATE: 05/17/22  NEXT MD VISIT: 12/24/22 with PCP   SUBJECTIVE:                                                                                                                                                                                                         SUBJECTIVE STATEMENT: Pt reports some difficulty with traveling, mostly as a result of a very thin mattress at the  lodging and more than expected time sitting while driving/flying. Since he has been back things are settling back down but he still notes occasional twinge with moving in bed.  PAIN: Are you having pain? No  PERTINENT HISTORY:  Prediabetes, obesity hypothyroidism, arthralgia, history of gout, sleep apnea, depression, GERD  PRECAUTIONS: None  RED FLAGS: None  WEIGHT BEARING RESTRICTIONS: No  FALLS:  Has patient fallen in last 6 months? 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 Return to normal activities pain free."   OBJECTIVE: (objective measures completed at initial evaluation unless otherwise dated)  DIAGNOSTIC FINDINGS:  11/24/22 - DG Lumbar Spine Complete: FINDINGS: There is no evidence of lumbar spine fracture. Alignment is normal. Minimal osteoarthritic changes with mild disc space narrowing and anterior osteophyte formation. 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  PATIENT SURVEYS:  Modified Oswestry 21 / 50 = 42.0 %  FOTO Lumbar spine = 34, predicted = 55  01/02/23: Modified Oswestry 1 / 50 = 2.0 %  FOTO Lumbar spine = 83  SCREENING FOR RED FLAGS: Bowel or bladder incontinence: No Spinal tumors: No Cauda equina syndrome: No Compression fracture: No Abdominal aneurysm: No  COGNITION:  Overall  cognitive status: Within functional limits for tasks assessed    SENSATION: WFL Pt reports intermittent numbness into B LE with prolonged sitting  MUSCLE LENGTH: (unable to assess on eval due to movement intolerance) Hamstrings:  ITB:  Piriformis:  Hip flexors:  Quads:  Heelcord:   POSTURE:  decreased lumbar lordosis  PALPATION: Increased muscle tension/spasms in thoracolumbar paraspinals and QL - most tender at thoracolumbar junction  LUMBAR ROM:   Active  Eval  01/02/23  Flexion Hands to knees ^ WFL  Extension 50% limited ^ WFL  Right lateral flexion Unable ^ WFL  Left lateral flexion Hand to mid thigh ^ WFL  Right rotation Unable ^ WFL  Left rotation Unable ^ WFL  (Blank rows = not tested, ^ = increased pain)  LOWER EXTREMITY ROM:  Unable to assess on eval due to movement intolerance. WFL/WNL as of 01/02/23.  LOWER EXTREMITY MMT:  (tested in sitting on eval, standard testing positions on 01/02/23)  MMT Right eval Left eval R 01/02/23 L 01/02/23  Hip flexion 4+ 4+ 5 5  Hip extension   5 5  Hip abduction 5 5 5 5   Hip adduction 4+ 4+ 5 4+  Hip internal rotation 5 5 5 5   Hip external rotation 4+ 5 5 5   Knee flexion 5 5 5 5   Knee extension 5 5 5 5   Ankle dorsiflexion 5 5 5 5   Ankle plantarflexion   5 5  Ankle inversion      Ankle eversion       (Blank rows = not tested)  LUMBAR SPECIAL TESTS:  Slump test: Negative   TODAY'S TREATMENT:   01/02/23 THERAPEUTIC EXERCISE: to improve flexibility, strength and mobility.  Demonstration, verbal and tactile cues throughout for technique.  Rec Bike - L4 x 6 min HEP review - provided handouts for exercises added last visit when MedBridge not working  THERAPEUTIC ACTIVITIES: Modified Oswestry 1 / 50 = 2.0 %  FOTO Lumbar spine = 83, predicted = 55 Lumbar ROM assessment LE MMT Goal assessment   12/23/22 THERAPEUTIC  EXERCISE: to improve flexibility, strength and mobility.  Demonstration, verbal and tactile cues  throughout for technique.  Rec Bike - L4 x 7 min L KTOS glute/piriformis stretch with towel assist 3 x 30" Bridge + GTB hip ABD/ER clam 11 x 3-5" S/L L GTB clam 10 x 3"  MANUAL THERAPY: To promote normalized muscle tension, improved flexibility, improved joint mobility, and reduced pain. Skilled palpation and monitoring of soft tissue during DN Trigger Point Dry-Needling  Treatment instructions: Expect mild to moderate muscle soreness. Patient verbalized understanding of these instructions and education. Patient Consent Given: Yes Education handout provided: No Muscles treated: L lateral glute medius  Electrical stimulation performed: No Parameters: N/A Treatment response/outcome: Twitch Response Elicited and Palpable Increase in Muscle Length STM/DTM, manual TPR and pin & stretch to muscles addressed with DN   12/19/22 THERAPEUTIC EXERCISE: to improve flexibility, strength and mobility.  Demonstration, verbal and tactile cues throughout for technique.  Rec Bike - L4 x 6 min LTR with distal LE on orange ball x 10 DKTC with heels on orange ball x 10 Hooklying PPT 10 x 5" Hooklying PPT roll-up into bridge stopping shy of triggering "catch" x 10  MANUAL THERAPY: To promote normalized muscle tension, improved flexibility, increased ROM, and reduced pain. Skilled palpation and monitoring of soft tissue during DN Trigger Point Dry-Needling  Treatment instructions: Expect mild to moderate muscle soreness. S/S of pneumothorax if dry needled over a lung field, and to seek immediate medical attention should they occur. Patient verbalized understanding of these instructions and education. Patient Consent Given: Yes Education handout provided: Previously provided Muscles treated: R QL & erector spinae Electrical stimulation performed: No Parameters: N/A Treatment response/outcome: Twitch Response Elicited and Palpable Increase in Muscle Length STM/DTM, manual TPR and pin & stretch to muscles  addressed with DN  THERAPEUTIC ACTIVITIES: Guided patient in logrolling and side-lying to sit to minimize strain on low back - patient reporting it still does not feel quite normal but less discomfort than previously Provided education in proper posture and body mechanics for typical daily positioning and household chores to minimize strain on low back.   PATIENT EDUCATION:  Education details: HEP review and recommended frequency for ongoing HEP at discharge to prevent loss of gains achieved with PT Person educated: Patient Education method: Chief Technology Officer Education comprehension: verbalized understanding  HOME EXERCISE PROGRAM: Access Code: [email or web address] URL: [email or web address] Date: 01/02/2023 Prepared by: Glenetta Hew  Exercises - Supine Posterior Pelvic Tilt  - 1 x daily - 7 x weekly - 3 sets - 10 reps - 3 sec hold - Supine Lower Trunk Rotation  - 1 x daily - 7 x weekly - 3 sets - 10 reps - 5 sec hold - Bent Knee Fallouts  - 1 x daily - 7 x weekly - 3 sets - 10 reps - Supine March  - 1 x daily - 7 x weekly - 3 sets - 10 reps - Sidelying Thoracic Rotation with Open Book  - 1 x daily - 7 x weekly - 2 sets - 10 reps - 5 sec  hold - Seated Quadratus Lumborum Stretch in Chair  - 2 x daily - 7 x weekly - 3 reps - 30 sec hold - Standing Quadratus Lumborum Stretch with Doorway  - 2 x daily - 7 x weekly - 3 reps - 30 sec hold - Standing Shoulder Row with Anchored Resistance  - 1 x daily - 7 x weekly - 2 sets - 10 reps - 5  sec hold - Scapular Retraction with Resistance Advanced  - 1 x daily - 7 x weekly - 2 sets - 10 reps - 5 sec hold - Standing Anti-Rotation Press with Anchored Resistance  - 1 x daily - 7 x weekly - 2 sets - 10 reps - 3 sec hold - Seated Table Piriformis Stretch  - 2-3 x daily - 7 x weekly - 3 reps - 30 sec hold - Bridge with Hip Abduction and Resistance  - 1 x daily - 3 x weekly - 2 sets - 10 reps - 5 sec hold - Clam with Resistance  - 1 x daily - 3  x weekly - 2 sets - 10 reps - 3-5 sec hold  Patient Education - Posture and Body Mechanics   ASSESSMENT:  CLINICAL IMPRESSION: Marc Fisher is very pleased with his progress, reporting 95% improvement in pain and only noting slight twinges with moving/repositioning in bed.  Modified Oswestry and lumbar FOTO have greatly exceeded predicted/goal values.  He is now able to demonstrate functional lumbar and LE ROM in all planes w/o pain. LE strength now grossly 5/5 with only L hip adduction at 4+/5.  He reports he is able to complete all normal daily activities w/o limitation due to pain.  All PT goals now met and Marc Fisher feels ready to transition to his HEP but would like to remain on hold for 30-days in the event that issues arise that would necessitate a return to PT.   OBJECTIVE IMPAIRMENTS: Abnormal gait, decreased activity tolerance, decreased balance, decreased endurance, decreased knowledge of condition, decreased knowledge of use of DME, decreased mobility, difficulty walking, decreased ROM, decreased strength, hypomobility, increased fascial restrictions, impaired perceived functional ability, increased muscle spasms, impaired flexibility, impaired sensation, improper body mechanics, postural dysfunction, and pain.   ACTIVITY LIMITATIONS: carrying, lifting, bending, sitting, standing, squatting, stairs, transfers, bed mobility, bathing, toileting, dressing, hygiene/grooming, locomotion level, and caring for others  PARTICIPATION LIMITATIONS: meal prep, cleaning, laundry, interpersonal relationship, driving, shopping, community activity, occupation, yard work, and school  PERSONAL FACTORS: Past/current experiences, Time since onset of injury/illness/exacerbation, and 3+ comorbidities: Prediabetes, obesity hypothyroidism, arthralgia, history of gout, sleep apnea, depression, GERD  are also affecting patient's functional outcome.   REHAB POTENTIAL: Good  CLINICAL DECISION MAKING: Evolving/moderate  complexity  EVALUATION COMPLEXITY: Moderate   GOALS: Goals reviewed with patient? Yes  SHORT TERM GOALS: Target date: 12/30/2022  Patient will be independent with initial HEP to improve outcomes and carryover.  Baseline: TBD Goal status: MET - 12/19/22  2.  Patient will report centralization of radicular symptoms.  Baseline: Intermittent B LE numbness typically with prolonged sitting (patient noted to sit at edge of chair with legs pressing down into edge of chair) Goal status: MET - 12/19/22 - pain now localized to R flank/QL, with no radicular numbness  LONG TERM GOALS: Target date: 01/27/2023   Patient will be independent with ongoing/advanced HEP for self-management at home.  Baseline:  Goal status: MET - 01/02/23   2.  Patient will report at least 50-75% improvement in low back pain to improve QOL.  Baseline: at rest (sitting) 2/10; with movement up to 9-10/10 Goal status: MET  01/02/23 - 95% improvement  3.  Patient to demonstrate ability to achieve and maintain good spinal alignment/posturing and body mechanics needed for daily activities. Baseline:  Goal status: MET - 01/02/23  4.  Patient will demonstrate functional pain free lumbar ROM to perform ADLs.   Baseline: Refer to above lumbar ROM table  Goal status: MET - 01/02/23   5.  Patient will demonstrate improved B LE strength to 5/5 for improved stability and ease of mobility . Baseline: Refer to above LE MMT table Goal status: MET - 01/02/23   6.  Patient will report 64 on lumbar FOTO to demonstrate improved functional ability.  Baseline: FOTO Lumbar spine = 34 Goal status: MET  - 01/02/23 - 83  7. Patient will report </= 30% on Modified Oswestry to demonstrate improved functional ability with decreased pain interference. Baseline: Modified Oswestry 21 / 50 = 42.0 %  Goal status: MET - 01/02/23 - 1 / 50 = 2.0 %  8.  Patient will tolerate transitional movements w/o increased pain to allow for improved mobility  and activity tolerance. Baseline: pain up to 9/10 with most transitional movements  Goal status: MET - 01/02/23 - improving tolerance for repositioning in bed and supine to sit w/o increased pain  9.  Patient to report ability to perform ADLs, household, and work-related tasks without limitation due to low back pain, muscle spasm, LOM or weakness Baseline:  Goal status: MET - 01/02/23    PLAN:  PT FREQUENCY: 2x/week  PT DURATION: 8 weeks  PLANNED INTERVENTIONS: Therapeutic exercises, Therapeutic activity, Neuromuscular re-education, Balance training, Gait training, Patient/Family education, Self Care, Joint mobilization, Aquatic Therapy, Dry Needling, Electrical stimulation, Spinal manipulation, Spinal mobilization, Cryotherapy, Moist heat, Taping, Vasopneumatic device, Traction, Ultrasound, Manual therapy, and Re-evaluation  PLAN FOR NEXT SESSION: Transition to HEP + 30-day hold   Marry Guan, PT 01/02/2023, 5:16 PM

## 2023-01-02 NOTE — Telephone Encounter (Signed)
Pharmacy Patient Advocate Encounter   Received notification from Patient Advice Request messages that prior authorization for [facility] is required/requested.   PA required; PA submitted to [facility] via CoverMyMeds Key/confirmation #/EOC [id number]. Status is pending

## 2023-01-06 ENCOUNTER — Encounter: Payer: BC Managed Care – PPO | Admitting: Physical Therapy

## 2023-01-06 NOTE — Telephone Encounter (Signed)
Pharmacy Patient Advocate Encounter  Received notification from [facility] that Prior Authorization for [facility] has been DENIED.  Full denial letter will be uploaded to the media tab. See denial reason below.

## 2023-01-13 ENCOUNTER — Ambulatory Visit: Payer: BC Managed Care – PPO

## 2023-01-13 ENCOUNTER — Encounter: Payer: BC Managed Care – PPO | Admitting: Physical Therapy

## 2023-01-13 ENCOUNTER — Telehealth: Payer: Self-pay

## 2023-01-13 DIAGNOSIS — Z23 Encounter for immunization: Secondary | ICD-10-CM | POA: Diagnosis not present

## 2023-01-13 NOTE — Progress Notes (Signed)
Patient presents to nurse clinic for flu and COVID vaccinations. See immunization flowsheet.   Patient observed 15 minutes post COVID injection. No signs of adverse reaction noted.   Provided patient with updated immunization record.   Veronda Prude, RN

## 2023-01-13 NOTE — Telephone Encounter (Signed)
Patient in nurse clinic for flu and COVID vaccine. He is asking about his allopurinol prescription.   He is requesting that new prescription be sent in for allopurinol 300 mg tablets, so that he will not have to take 3, 100 mg tablets.   Also, insurance does not typically cover when written this way.   Patient also asking about PA for ozempic. This was previously denied on 01/06/23. Please advise alternatives or next steps.   Veronda Prude, RN

## 2023-01-14 ENCOUNTER — Encounter: Payer: Self-pay | Admitting: Student

## 2023-01-15 ENCOUNTER — Other Ambulatory Visit: Payer: Self-pay | Admitting: Student

## 2023-01-15 DIAGNOSIS — E039 Hypothyroidism, unspecified: Secondary | ICD-10-CM

## 2023-01-15 MED ORDER — ALLOPURINOL 300 MG PO TABS: 300.0000 mg | ORAL_TABLET | Freq: Every day | ORAL | 3 refills | Status: DC

## 2023-01-15 NOTE — Progress Notes (Signed)
Referral to Dr. Talmage Nap with endocrinology sent.  Allopurinol 300 mg daily sent to pharmacy.

## 2023-01-27 ENCOUNTER — Other Ambulatory Visit: Payer: Self-pay | Admitting: Student

## 2023-03-03 ENCOUNTER — Encounter: Payer: Self-pay | Admitting: Student

## 2023-03-03 NOTE — Telephone Encounter (Signed)
 Care team updated and letter sent for eye exam notes.

## 2023-03-18 ENCOUNTER — Encounter: Payer: Self-pay | Admitting: Student

## 2023-03-20 ENCOUNTER — Other Ambulatory Visit (HOSPITAL_COMMUNITY): Payer: Self-pay

## 2023-03-20 ENCOUNTER — Telehealth: Payer: Self-pay

## 2023-03-20 NOTE — Telephone Encounter (Signed)
 Pharmacy Patient Advocate Encounter   Received notification from Patient Advice Request messages that prior authorization for ozempic  is required/requested.   Insurance verification completed.   The patient is insured through [facility] .   PA required; PA submitted to above mentioned insurance via CoverMyMeds Key/confirmation #/EOC [id number]. Status is pending

## 2023-03-21 ENCOUNTER — Other Ambulatory Visit (HOSPITAL_COMMUNITY): Payer: Self-pay

## 2023-03-21 NOTE — Telephone Encounter (Signed)
 Pharmacy Patient Advocate Encounter  Received notification from [facility] that Prior Authorization for OZEMPIC  has been DENIED.  Full denial letter will be uploaded to the media tab. See denial reason below.  Your plan only covers this drug when  A) your A1C is sent to us , and your test results are in a certain range (A1C greater than or equal to 6.5 percent),  B) your 2-hour plasma glucose (PG) during oral glucose tolerance test (OGTT) is sent to us , and your results are in a certain range (2-hour PG greater than or equal to 200 mg/dL),  C) your random plasma glucose is sent to us , and your test results are in a certain range (random plasma glucose greater than or equal to 200 mg/dL with symptoms of hyperglycemia (e.g., polyuria, polydipsia, polyphagia) or hyperglycemic crisis), or  D) your fasting plasma glucose (FPG) is sent to us , and your results are in a certain range (FPG greater than or equal to 126 mg/dL).   We denied your request because:  A) We did not receive your results, or  B) Your results were not in the approvable range. We reviewed the information we had. Your request has been denied.

## 2023-04-05 ENCOUNTER — Other Ambulatory Visit: Payer: Self-pay | Admitting: Student

## 2023-04-07 ENCOUNTER — Other Ambulatory Visit: Payer: Self-pay | Admitting: Student

## 2023-05-02 ENCOUNTER — Encounter: Payer: Self-pay | Admitting: Student

## 2023-05-09 ENCOUNTER — Other Ambulatory Visit: Payer: Self-pay

## 2023-05-10 MED ORDER — ALLOPURINOL 300 MG PO TABS: 300.0000 mg | ORAL_TABLET | Freq: Every day | ORAL | 3 refills | Status: DC

## 2023-05-27 ENCOUNTER — Ambulatory Visit (INDEPENDENT_AMBULATORY_CARE_PROVIDER_SITE_OTHER): Payer: Self-pay | Admitting: Student

## 2023-05-27 VITALS — BP 110/70 | HR 70 | Ht 68.0 in | Wt 276.0 lb

## 2023-05-27 DIAGNOSIS — L649 Androgenic alopecia, unspecified: Secondary | ICD-10-CM | POA: Diagnosis not present

## 2023-05-27 DIAGNOSIS — R4589 Other symptoms and signs involving emotional state: Secondary | ICD-10-CM | POA: Diagnosis not present

## 2023-05-27 DIAGNOSIS — E66813 Obesity, class 3: Secondary | ICD-10-CM | POA: Diagnosis not present

## 2023-05-27 DIAGNOSIS — Z6841 Body Mass Index (BMI) 40.0 and over, adult: Secondary | ICD-10-CM

## 2023-05-27 DIAGNOSIS — R7303 Prediabetes: Secondary | ICD-10-CM

## 2023-05-27 LAB — POCT GLYCOSYLATED HEMOGLOBIN (HGB A1C): HbA1c, POC (controlled diabetic range): 5.7 % (ref 0.0–7.0)

## 2023-05-27 MED ORDER — FINASTERIDE 5 MG PO TABS: 5.0000 mg | ORAL_TABLET | Freq: Every day | ORAL | 0 refills | Status: DC

## 2023-05-27 NOTE — Patient Instructions (Signed)
 It was great seeing you today.  As we discussed, - A1c and PSA today - Finasteride sent to pharmacy   If you have any questions or concerns, please feel free to call the clinic.   Have a wonderful day,  Dr. Darral Dash [facility] [phone]

## 2023-05-27 NOTE — Progress Notes (Signed)
    SUBJECTIVE:   CHIEF COMPLAINT / HPI: Mood changes, Male pattern baldness  Mood changes He reports a significant emotional upheaval due to a complicated personal relationship, which has led to feelings of heartbreak and a significant impact on his mental health. He describes symptoms of grief, including difficulty sleeping and a decrease in overall functioning. He has sought help through therapy and has called a crisis hotline for support.  Obesity Despite these challenges, he has managed to lose significant weight, which he attributes to a combination of medication (Mounjaro) and dietary changes under the guidance of a nutritionist. He denies any adverse effects from the medication and has maintained a daily caloric intake of around 2000 calories.  Male pattern baldness Reports no improvement with Minoxidil . He is interested in trying  Finasteride .   OBJECTIVE:   BP 110/70   Pulse 70   Ht 5' 8 (1.727 m)   Wt 276 lb (125.2 kg)   SpO2 97%   BMI 41.97 kg/m   General: NAD, well appearing, pleasant and intermittently tearful Cardiac: RRR Neuro: A&O Respiratory: normal WOB on RA. No wheezing or crackles on auscultation, good lung sounds throughout Extremities: Moving all 4 extremities equally  ASSESSMENT/PLAN:   Male pattern baldness PSA lab today Start finasteride  5 mg daily.  Obesity On Mounjaro 0.25 mg with 30-pound weight loss. Declined dose increase, well-managed without adverse effects. - Continue Mounjaro 0.25 mg. - Monitor weight and adjust treatment as necessary.   Emotional distress Significant emotional distress due to breakup and interpersonal issues. Engaged in therapy and using support systems. No active SI or HI. Good insight and judgement. - Continue therapy sessions with new therapist. - Encourage use of 988 hotline.      Travonne Schowalter, DO [facility]

## 2023-05-28 DIAGNOSIS — R4589 Other symptoms and signs involving emotional state: Secondary | ICD-10-CM | POA: Insufficient documentation

## 2023-05-28 LAB — PSA: Prostate Specific Ag, Serum: 1.5 ng/mL (ref 0.0–4.0)

## 2023-05-28 NOTE — Assessment & Plan Note (Signed)
 Significant emotional distress due to breakup and interpersonal issues. Engaged in therapy and using support systems. No active SI or HI. Good insight and judgement. - Continue therapy sessions with new therapist. - Encourage use of 988 hotline.

## 2023-05-28 NOTE — Assessment & Plan Note (Addendum)
 PSA lab today Start finasteride 5 mg daily.

## 2023-05-28 NOTE — Assessment & Plan Note (Signed)
 On Mounjaro 0.25 mg with 30-pound weight loss. Declined dose increase, well-managed without adverse effects. - Continue Mounjaro 0.25 mg. - Monitor weight and adjust treatment as necessary.

## 2023-06-20 ENCOUNTER — Encounter: Payer: Self-pay | Admitting: Student

## 2023-06-26 ENCOUNTER — Encounter: Payer: Self-pay | Admitting: Student

## 2023-06-27 MED ORDER — PANTOPRAZOLE SODIUM 20 MG PO TBEC: 20.0000 mg | DELAYED_RELEASE_TABLET | Freq: Every day | ORAL | 3 refills | Status: AC

## 2023-07-02 ENCOUNTER — Telehealth: Admitting: Student

## 2023-07-08 ENCOUNTER — Telehealth (INDEPENDENT_AMBULATORY_CARE_PROVIDER_SITE_OTHER): Admitting: Student

## 2023-07-08 DIAGNOSIS — R0789 Other chest pain: Secondary | ICD-10-CM | POA: Diagnosis not present

## 2023-07-08 DIAGNOSIS — R4589 Other symptoms and signs involving emotional state: Secondary | ICD-10-CM | POA: Diagnosis not present

## 2023-07-08 MED ORDER — HYDROXYZINE HCL 10 MG PO TABS: 10.0000 mg | ORAL_TABLET | Freq: Two times a day (BID) | ORAL | 0 refills | Status: AC | PRN

## 2023-07-08 NOTE — Progress Notes (Signed)
 [facility] Telemedicine Visit  Patient consented to have virtual visit and was identified by name and date of birth. Method of visit: Video  Encounter participants: Patient: Marc Fisher - located at workplace Provider: Vallorie Gayer - located at [facility] Others (if applicable): None  Chief Complaint:   HPI:  Reports chest pains that come and go intermittently, that seems more dependent on his emotional state. When the pain occurs, it is substernal and if he tries to take a deep breath it hurts.  No history of significant heart disease in family. Denies dyspnea on exertion, syncope or presyncope. No radiation of pain to jaw or neck.  Sender wonders if his chest pain is related to anxiety.  He is going to couple's therapy and personal therapy. He and his partners have worked things out and he is having a good day.  ROS: per HPI  Pertinent PMHx:  T2DM Hypothyroidism  Exam:  There were no vitals taken for this visit.  Respiratory: Speaks in full sentences  Assessment/Plan:  Emotional distress Symptoms of anxiety, but improving. No SI/HI Relationship with partners improving, going to counseling Prescribed hydroxyzine  10 mg BID PRN for anixety; discussed drowsiness as side effect Praised patient on efforts to help with reducing anxiety- listening to podcasts, physical affection, therapy  Chest discomfort While anxiety could certainly be contributor to symptoms, do not want to miss angina/anigna equivalent given risk factors (T2DM, obesity) Intermittent, substernal nature fitting with typical chest pain Patient to schedule in person visit- would get EKG, consider stress test, and possibly anti-anignal medications Reassured that sometimes the pain is worsened with palpation of chest, but also discussed emotional distress can worsen angina ED Return precautions discussed    Time spent during visit with patient: 10  minutes

## 2023-07-08 NOTE — Assessment & Plan Note (Signed)
 Symptoms of anxiety, but improving. No SI/HI Relationship with partners improving, going to counseling Prescribed hydroxyzine  10 mg BID PRN for anixety; discussed drowsiness as side effect Praised patient on efforts to help with reducing anxiety- listening to podcasts, physical affection, therapy

## 2023-07-08 NOTE — Assessment & Plan Note (Signed)
 While anxiety could certainly be contributor to symptoms, do not want to miss angina/anigna equivalent given risk factors (T2DM, obesity) Intermittent, substernal nature fitting with typical chest pain Patient to schedule in person visit- would get EKG, consider stress test, and possibly anti-anignal medications Reassured that sometimes the pain is worsened with palpation of chest, but also discussed emotional distress can worsen angina ED Return precautions discussed

## 2023-07-10 ENCOUNTER — Encounter: Payer: Self-pay | Admitting: Student

## 2023-07-10 ENCOUNTER — Ambulatory Visit (INDEPENDENT_AMBULATORY_CARE_PROVIDER_SITE_OTHER): Admitting: Student

## 2023-07-10 VITALS — BP 115/71 | HR 68 | Ht 68.0 in | Wt 263.8 lb

## 2023-07-10 DIAGNOSIS — R0789 Other chest pain: Secondary | ICD-10-CM | POA: Diagnosis not present

## 2023-07-10 NOTE — Progress Notes (Signed)
    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  PERTINENT  PMH / PSH:    OBJECTIVE:   BP 115/71   Pulse 68   Ht 5\' 8"  (1.727 m)   Wt 263 lb 12.8 oz (119.7 kg)   SpO2 98%   BMI 40.11 kg/m   General: NAD, well appearing Cardiac: RRR Neuro: A&O Respiratory: normal WOB on RA. No wheezing or crackles on auscultation, good lung sounds throughout Extremities: Moving all 4 extremities equally  ASSESSMENT/PLAN:   Chest discomfort Referral placed to cardiology for recurrent intermittent chest discomfort Risk factors include obesity, T2DM Stable here today, no acute chest pain in clinic LDL is 72, at goal ED return precautions discussed.  The 10-year ASCVD risk score (Arnett DK, et al., 2019) is: 1.6%   Values used to calculate the score:     Age: 48 years     Sex: Male     Is Non-Hispanic African American: No     Diabetic: Yes     Tobacco smoker: No     Systolic Blood Pressure: 115 mmHg     Is BP treated: No     HDL Cholesterol: 54 mg/dL     Total Cholesterol: 141 mg/dL     Vallorie Gayer, DO [facility]

## 2023-07-10 NOTE — Assessment & Plan Note (Addendum)
 Referral placed to cardiology for recurrent intermittent chest discomfort Risk factors include obesity, T2DM Stable here today, no acute chest pain in clinic LDL is 72, at goal ED return precautions discussed.  The 10-year ASCVD risk score (Arnett DK, et al., 2019) is: 1.6%   Values used to calculate the score:     Age: 48 years     Sex: Male     Is Non-Hispanic African American: No     Diabetic: Yes     Tobacco smoker: No     Systolic Blood Pressure: 115 mmHg     Is BP treated: No     HDL Cholesterol: 54 mg/dL     Total Cholesterol: 141 mg/dL

## 2023-07-10 NOTE — Patient Instructions (Signed)
 It was great seeing you today.  As we discussed, - I sent a referral to cardiology - If you have any chest pain that does not go away and is associated with nausea/vomiting/diaphoresis, go to the ER   If you have any questions or concerns, please feel free to call the clinic.   Have a wonderful day,  Dr. Vallorie Gayer [facility] [phone]

## 2023-08-15 ENCOUNTER — Other Ambulatory Visit: Payer: Self-pay | Admitting: Student

## 2023-08-15 ENCOUNTER — Encounter: Payer: Self-pay | Admitting: Student

## 2023-08-15 MED ORDER — PAXLOVID (300/100) 20 X 150 MG & 10 X 100MG PO TBPK: 3.0000 | ORAL_TABLET | Freq: Two times a day (BID) | ORAL | 0 refills | Status: DC

## 2023-08-15 MED ORDER — PAXLOVID (300/100) 20 X 150 MG & 10 X 100MG PO TBPK: 3.0000 | ORAL_TABLET | Freq: Two times a day (BID) | ORAL | 0 refills | Status: AC

## 2023-08-15 NOTE — Progress Notes (Signed)
 Paxlovid sent to pharmacy. No medication interactions.

## 2023-08-19 ENCOUNTER — Other Ambulatory Visit: Payer: Self-pay | Admitting: Student

## 2023-08-25 ENCOUNTER — Encounter: Payer: Self-pay | Admitting: Student

## 2023-08-25 ENCOUNTER — Ambulatory Visit: Payer: Self-pay | Admitting: Podiatry

## 2023-08-25 ENCOUNTER — Encounter: Payer: Self-pay | Admitting: Podiatry

## 2023-08-25 ENCOUNTER — Ambulatory Visit

## 2023-08-25 DIAGNOSIS — M205X1 Other deformities of toe(s) (acquired), right foot: Secondary | ICD-10-CM | POA: Diagnosis not present

## 2023-08-25 DIAGNOSIS — M205X2 Other deformities of toe(s) (acquired), left foot: Secondary | ICD-10-CM | POA: Diagnosis not present

## 2023-08-25 DIAGNOSIS — E119 Type 2 diabetes mellitus without complications: Secondary | ICD-10-CM | POA: Diagnosis not present

## 2023-08-25 DIAGNOSIS — M7752 Other enthesopathy of left foot: Secondary | ICD-10-CM

## 2023-08-25 NOTE — Patient Instructions (Signed)

## 2023-08-26 ENCOUNTER — Encounter: Payer: Self-pay | Admitting: *Deleted

## 2023-08-27 NOTE — Progress Notes (Signed)
  Subjective:  Patient ID: Marc Fisher, male    DOB: 1976-03-07,  MRN: [id number]  Chief Complaint  Patient presents with   [facility]    RM#11 [facility] Left pinky callus causing pain.    Discussed the use of AI scribe software for clinical note transcription with the patient, who gave verbal consent to proceed.  History of Present Illness Marc Fisher is a 48 year old male with diabetes who presents with soreness on the side of his fifth toe.  Soreness is localized to the side of the fifth toe, with occasional similar soreness on the right fifth toe. There are no non-healing wounds, numbness, or tingling in the feet. Occasional leg cramps occur at night. Diabetes is managed with Mounjaro, reducing A1c from 7.1 to 5.7, but blood sugar is not regularly monitored at home. 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 His grandmother had corns.      Objective:    Physical Exam General: AAO x3, NAD  Dermatological: Hyperkeratotic lesions noted just lateral to the fifth digit toenail bilaterally left side worse than the right.  There is no underlying ulceration, drainage or signs of infection.  No open lesions noted bilaterally.  Vascular: Dorsalis Pedis artery and Posterior Tibial artery pedal pulses are 2/4 bilateral with immedate capillary fill time. There is no pain with calf compression, swelling, warmth, erythema.   Neruologic: Grossly intact via light touch bilateral.  Sensation intact with Semmes Weinstein monofilament.  Musculoskeletal: Adductovarus fifth toes bilaterally.    Results   LABS A1c: 5.7 (05/2023)   Assessment:   1. 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 Acquired adductovarus rotation of toe, left   3. Encounter for diabetic foot exam [facility])      Plan:  Patient was evaluated and treated and all questions answered.  Assessment and Plan Assessment & Plan Corns and  callosities Corns on the fifth toe due to pressure from toe rotation. Non-surgical management preferred unless pain persists. - As a courtesy and sharp debrided hyperkeratotic lesions without any complications or bleeding. - Provide toe caps to cushion and reduce pressure. - Advise wearing shoes with wide, soft toe box. - Avoid narrow or stiff shoes.  Diabetes mellitus without complications Diabetes well-controlled with A1c of 5.7. No neuropathy or circulation issues. Reduced A1c from 7.1 with medication. - Encourage regular blood glucose monitoring. - Encourage regular diabetic foot exams. - Advise to report any foot cuts, scrapes, or wounds immediately.   Return if symptoms worsen or fail to improve.   Charity Conch DPM

## 2023-09-03 ENCOUNTER — Other Ambulatory Visit: Payer: Self-pay | Admitting: Family Medicine

## 2023-09-03 DIAGNOSIS — S39012D Strain of muscle, fascia and tendon of lower back, subsequent encounter: Secondary | ICD-10-CM

## 2023-09-05 ENCOUNTER — Ambulatory Visit: Admitting: Family Medicine

## 2023-09-05 ENCOUNTER — Encounter: Payer: Self-pay | Admitting: Family Medicine

## 2023-09-05 VITALS — BP 126/82 | HR 74 | Ht 68.5 in | Wt 255.4 lb

## 2023-09-05 DIAGNOSIS — Z0000 Encounter for general adult medical examination without abnormal findings: Secondary | ICD-10-CM | POA: Diagnosis not present

## 2023-09-05 DIAGNOSIS — L649 Androgenic alopecia, unspecified: Secondary | ICD-10-CM

## 2023-09-05 DIAGNOSIS — Z1211 Encounter for screening for malignant neoplasm of colon: Secondary | ICD-10-CM

## 2023-09-05 DIAGNOSIS — E039 Hypothyroidism, unspecified: Secondary | ICD-10-CM

## 2023-09-05 DIAGNOSIS — G894 Chronic pain syndrome: Secondary | ICD-10-CM | POA: Insufficient documentation

## 2023-09-05 DIAGNOSIS — K219 Gastro-esophageal reflux disease without esophagitis: Secondary | ICD-10-CM | POA: Diagnosis not present

## 2023-09-05 MED ORDER — ALLOPURINOL 300 MG PO TABS: 300.0000 mg | ORAL_TABLET | Freq: Every day | ORAL | 3 refills | Status: DC

## 2023-09-05 MED ORDER — GABAPENTIN 100 MG PO CAPS: 100.0000 mg | ORAL_CAPSULE | Freq: Every day | ORAL | 0 refills | Status: AC | PRN

## 2023-09-05 MED ORDER — FINASTERIDE-MINOXIDIL 0.1-7 % EX SOLN: CUTANEOUS | 0 refills | Status: AC

## 2023-09-05 MED ORDER — FAMOTIDINE 20 MG PO TABS: 20.0000 mg | ORAL_TABLET | Freq: Every day | ORAL | 0 refills | Status: DC

## 2023-09-05 MED ORDER — LEVOTHYROXINE SODIUM 200 MCG PO TABS: 200.0000 ug | ORAL_TABLET | Freq: Every day | ORAL | 1 refills | Status: AC

## 2023-09-05 NOTE — Assessment & Plan Note (Addendum)
 No longer on NP thyroid  and back on levothyroxine .  Refill today.  Recheck TSH at next visit in 3 to 4 months.

## 2023-09-05 NOTE — Assessment & Plan Note (Signed)
 We will send in Pepcid 20 mg daily to take for 1 week with Protonix  20 mg daily.  That advised to stop Protonix  and continue with Pepcid 20 mg daily.  He can return to Protonix  therapy if symptoms rebound.

## 2023-09-05 NOTE — Assessment & Plan Note (Signed)
 Ordered colonoscopy today

## 2023-09-05 NOTE — Assessment & Plan Note (Addendum)
 Appears a finasteride -minoxidil  external solution combo can be sent in; however, unlikely insurance will cover.  Will reach out to patient about preferences for next steps.

## 2023-09-05 NOTE — Progress Notes (Signed)
    SUBJECTIVE:   CHIEF COMPLAINT / HPI:   Medication refills and meet new PCP Patient needs refills on his levothyroxine , allopurinol , gabapentin .  GERD He has been on Protonix  20 mg daily for a long time.  He has never tried Pepcid.  He is interested in transitioning therapy.  Male pattern baldness Continues to use minoxidil  without much help.  Endorses concern with using oral finasteride  due to hormonal effects.  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 [employer]; going to [place]  and [place]  this summer; he has a history of sleep apnea, hypothyroidism, male pattern baldness, prediabetes, and gout  OBJECTIVE:   BP 126/82   Pulse 74   Ht 5' 8.5 (1.74 m)   Wt 255 lb 6.4 oz (115.8 kg)   SpO2 96%   BMI 38.27 kg/m   General: Alert and oriented, in NAD Skin: Warm, dry, and intact without lesions HEENT: NCAT, EOM grossly normal, midline nasal septum Cardiac: RRR, no m/r/g appreciated Respiratory: CTAB, breathing and speaking comfortably on RA Abdominal: Soft, nontender, nondistended, normoactive bowel sounds Extremities: Moves all extremities grossly equally Neurological: No gross focal deficit Psychiatric: Appropriate mood and affect   ASSESSMENT/PLAN:   Assessment & Plan Gastroesophageal reflux disease without esophagitis We will send in Pepcid 20 mg daily to take for 1 week with Protonix  20 mg daily.  That advised to stop Protonix  and continue with Pepcid 20 mg daily.  He can return to Protonix  therapy if symptoms rebound. Male pattern baldness Appears a finasteride -minoxidil  external solution combo can be sent in; however, unlikely insurance will cover.  Will reach out to patient about preferences for next steps. Hypothyroidism, unspecified type No longer on NP thyroid  and back on levothyroxine .  Refill today.  Recheck TSH at next visit in 3 to 4 months. Healthcare maintenance Ordered colonoscopy today. Pain syndrome, chronic    Genetta Kenning, MD [facility]  [facility]

## 2023-09-05 NOTE — Patient Instructions (Addendum)
 I have sent in medication refills. Come back in around 3-4 months to check in on labs or sooner if needed  I also referred you for a colonoscopy.

## 2023-10-11 ENCOUNTER — Other Ambulatory Visit: Payer: Self-pay | Admitting: Family Medicine

## 2023-10-13 ENCOUNTER — Telehealth: Admitting: Family Medicine

## 2023-10-13 DIAGNOSIS — B851 Pediculosis due to Pediculus humanus corporis: Secondary | ICD-10-CM | POA: Diagnosis not present

## 2023-10-13 MED ORDER — PERMETHRIN 5 % EX CREA: 1.0000 | TOPICAL_CREAM | Freq: Once | CUTANEOUS | 1 refills | Status: AC

## 2023-10-13 NOTE — Patient Instructions (Signed)
Lice, Adult     Lice are tiny insects with claws on the ends of their legs. They are parasites, which means they need to live off another animal to survive. Lice hatch from little round eggs (nits) that are attached to the base of hairs. There are three different types of lice: Head lice. These lice live on the hair on a person's head. Body lice. These lice live on body hair. Pubic lice. These lice live on pubic hair. Lice can spread from one person to another. You cannot get lice from a pet. Lice crawl, they do not fly or jump. Lice cause skin irritation and intense itching in the area of the affected hair. Although having lice can be frustrating, it is not dangerous. Lice do not spread diseases. Treatment will usually eliminate symptoms within a few days. What are the causes? Head and body lice may be caused by: Very close contact with a person who has lice. Sharing items that touch the skin and hair with a person who has lice. These include personal items, such as hats, combs, brushes, towels, clothing, pillowcases, or sheets. Lying on a bed, couch, or carpet that was recently used by someone with lice. Pubic lice are spread through sexual contact. What increases the risk? Although having lice is more common among young children, anyone can get lice. Warm weather increases the risk of getting lice. What are the signs or symptoms? Symptoms of this condition include: Itchiness in the affected area. Skin irritation. Rash or sores on the skin. The feeling of something moving in the hair. Tiny flakes or nits near the scalp. These may be white, yellow, or tan. Tiny bugs crawling on the hair, scalp, or genital area. Trouble sleeping, because lice are most active in the dark. How is this diagnosed? This condition is diagnosed based on: Signs and symptoms. A physical exam. Your health care provider will examine the affected area closely for live lice, nits, and empty nit cases. Nits are  typically white, yellow, or tan in color. Empty nit cases are white. Lice are gray or brown in color and about the size of a sesame seed. How is this treated? Treatment for this condition includes: Using a hair rinse that contains a mild insecticide to kill lice. Your health care provider will recommend a prescription or an over-the-counter hair rinse. Removing lice, nits, and nit cases using a comb or tweezers. Washing clothing, towels, and bedding in hot water. Dry them afterward. Bagging items that cannot be washed or vacuumed. Pregnant women should not use medicated shampoo or cream without first talking to a health care provider. Follow these instructions at home: Using medicated rinse Apply medicated hair rinse as told by your health care provider or as per the package instructions. Follow the label instructions carefully. General instructions for applying rinses may include these steps: Put on an old shirt or underwear, or use an old towel in case of staining from the hair rinse. Wash and towel-dry your head or pubic area before applying the rinse if directed to do so. When your hair is dry, apply the rinse. Leave the rinse in your hair for the amount of time specified in the instructions. Rinse the area with water. Comb your wet hair with a fine-tooth comb. Comb it close to the skin. Comb from the root down to the ends, removing any lice, nits, or nit cases. A lice comb may be included with the medicated rinse. Do not wash your hair for 2  days while the medicine kills the lice. After the treatment, repeat combing out your hair and removing lice, nits, or nit cases from the hair every 2-3 days. Do this for about 2-3 weeks. After treatment, the remaining lice should be moving more slowly. Repeat the treatment in 7-10 days if necessary.  Removing lice from other items Use hot water to wash all towels, hats, scarves, jackets, bedding, and clothing that you have recently used. Dry the items  using the hot air cycle. Put any non-washable items that may have been exposed into plastic bags. Keep the bags tightly closed for 2 weeks to kill any remaining lice. Make sure that there are no holes in the bags. Soak all combs and brushes in hot water for 5 to 10 minutes. Vacuum carpets, mattresses, and upholstered furniture to remove any loose hair. Do not use chemicals, which can be poisonous (toxic). Lice survive for only 1-2 days away from human skin. Nits may survive for up to 1 week. General instructions Remove any remaining lice, nits, or nit cases from hair using a fine-tooth comb. For head lice, ask your health care provider if other family members or close contacts should be examined or treated as well. For pubic lice, tell any sexual partners to get treatment. Keep all follow-up visits. This is important. Contact a health care provider if: You develop sores that look infected. Your rash or sores do not go away in 1 week. The lice or nits return or do not go away after treatment. Summary Lice are tiny parasitic insects that live on the human body. A parasite needs to live off another animal to survive. Lice can spread from one person to another through close contact with a person who has lice or by sharing personal items, such as combs, brushes, towels, clothes, or hats. Pubic lice are spread through sexual contact. Lice can be treated with a medicated hair rinse. Follow your health care provider's instructions, or instructions on the package label, if you are being treated with this medicine. For head lice, ask your health care provider if other family members or close contacts should be examined or treated as well. For pubic lice, tell any sexual partners to get treatment. This information is not intended to replace advice given to you by your health care provider. Make sure you discuss any questions you have with your health care provider. Document Revised: 05/28/2021 Document  Reviewed: 05/08/2021 Elsevier Patient Education  2024 [employer].

## 2023-10-13 NOTE — Progress Notes (Signed)
 Virtual Visit Consent   Marc Fisher, you are scheduled for a virtual visit with a [facility] provider today. Just as with appointments in the office, your consent must be obtained to participate. Your consent will be active for this visit and any virtual visit you may have with one of our providers in the next 365 days. If you have a MyChart account, a copy of this consent can be sent to you electronically.  As this is a virtual visit, video technology does not allow for your provider to perform a traditional examination. This may limit your provider's ability to fully assess your condition. If your provider identifies any concerns that need to be evaluated in person or the need to arrange testing (such as labs, EKG, etc.), we will make arrangements to do so. Although advances in technology are sophisticated, we cannot ensure that it will always work on either your end or our end. If the connection with a video visit is poor, the visit may have to be switched to a telephone visit. With either a video or telephone visit, we are not always able to ensure that we have a secure connection.  By engaging in this virtual visit, you consent to the provision of healthcare and authorize for your insurance to be billed (if applicable) for the services provided during this visit. Depending on your insurance coverage, you may receive a charge related to this service.  I need to obtain your verbal consent now. Are you willing to proceed with your visit today? Marc Fisher has provided verbal consent on 10/13/2023 for a virtual visit (video or telephone). Loa Lamp, FNP  Date: 10/13/2023 9:59 AM   Virtual Visit via Video Note   I, Loa Lamp, connected with  Marc Fisher  ([id number], [birth year]) on 10/13/23 at  9:15 AM EDT by a video-enabled telemedicine application and verified that I am speaking with the correct person using two identifiers.  Location: Patient: Virtual Visit Location Patient:  Home Provider: Virtual Visit Location Provider: Home Office   I discussed the limitations of evaluation and management by telemedicine and the availability of in person appointments. The patient expressed understanding and agreed to proceed.    History of Present Illness: Marc Fisher is a 48 y.o. who identifies as a male who was assigned male at birth, and is being seen today for possible lice since taking trip 2 weeks ago. Chest is irritated and he has pulled 6 bugs off him. He says he had lice several yrs ago and they look like that. SABRA  HPI: HPI  Problems:  Patient Active Problem List   Diagnosis Date Noted   GERD (gastroesophageal reflux disease) 09/05/2023   Healthcare maintenance 09/05/2023   Pain syndrome, chronic 09/05/2023   Chest discomfort 07/08/2023   Emotional distress 05/28/2023   Prediabetes 12/27/2022   Male pattern baldness 12/27/2022   History of gout 05/14/2018   Hypothyroidism 07/21/2015   Obesity 07/21/2015   Sleep apnea 07/21/2015    Allergies:  Allergies  Allergen Reactions   Penicillins     hives   Sulfa Antibiotics     Skin blisters and peels.   Medications:  Current Outpatient Medications:    permethrin  (ELIMITE ) 5 % cream, Apply 1 Application topically once for 1 dose. Apply head to toe, Disp: 1 g, Rfl: 1   allopurinol  (ZYLOPRIM ) 300 MG tablet, Take 1 tablet (300 mg total) by mouth daily., Disp: 30 tablet, Rfl: 3   famotidine  (PEPCID ) 20 MG tablet, TAKE 1 TABLET(20  MG) BY MOUTH DAILY, Disp: 30 tablet, Rfl: 11   Finasteride -Minoxidil  0.1-7 % SOLN, Apply to area of hair loss once daily., Disp: 60 g, Rfl: 0   gabapentin  (NEURONTIN ) 100 MG capsule, Take 1 capsule (100 mg total) by mouth daily as needed (pain)., Disp: 30 capsule, Rfl: 0   hydrOXYzine  (ATARAX ) 10 MG tablet, Take 1 tablet (10 mg total) by mouth 2 (two) times daily as needed for anxiety., Disp: 30 tablet, Rfl: 0   levothyroxine  (SYNTHROID ) 200 MCG tablet, Take 1 tablet (200 mcg total) by  mouth daily before breakfast., Disp: 90 tablet, Rfl: 1   methocarbamol  (ROBAXIN ) 750 MG tablet, TAKE 1 TABLET(750 MG) BY MOUTH EVERY 8 HOURS AS NEEDED FOR MUSCLE SPASMS, Disp: 30 tablet, Rfl: 0   pantoprazole  (PROTONIX ) 20 MG tablet, Take 1 tablet (20 mg total) by mouth daily., Disp: 90 tablet, Rfl: 3   tirzepatide (MOUNJARO) 2.5 MG/0.5ML Pen, Inject 2.5 mg into the skin once a week., Disp: , Rfl:   Observations/Objective: Patient is well-developed, well-nourished in no acute distress.  Resting comfortably  at home.  Head is normocephalic, atraumatic.  No labored breathing.  Speech is clear and coherent with logical content.  Patient is alert and oriented at baseline.    Assessment and Plan: 1. Body lice (Primary)  Med use discussed, see information sheet, UC as needed.   Follow Up Instructions: I discussed the assessment and treatment plan with the patient. The patient was provided an opportunity to ask questions and all were answered. The patient agreed with the plan and demonstrated an understanding of the instructions.  A copy of instructions were sent to the patient via MyChart unless otherwise noted below.     The patient was advised to call back or seek an in-person evaluation if the symptoms worsen or if the condition fails to improve as anticipated.    Kamariya Blevens, FNP

## 2023-11-02 ENCOUNTER — Other Ambulatory Visit: Payer: Self-pay | Admitting: Medical Genetics

## 2023-11-14 ENCOUNTER — Other Ambulatory Visit: Payer: Self-pay

## 2023-11-14 DIAGNOSIS — S39012D Strain of muscle, fascia and tendon of lower back, subsequent encounter: Secondary | ICD-10-CM

## 2023-11-14 MED ORDER — METHOCARBAMOL 750 MG PO TABS: 750.0000 mg | ORAL_TABLET | Freq: Three times a day (TID) | ORAL | 0 refills | Status: AC | PRN

## 2023-12-26 ENCOUNTER — Encounter: Payer: Self-pay | Admitting: Family Medicine

## 2023-12-29 MED ORDER — PERMETHRIN 5 % EX CREA: 1.0000 | TOPICAL_CREAM | Freq: Once | CUTANEOUS | 0 refills | Status: AC

## 2024-01-05 ENCOUNTER — Encounter: Payer: Self-pay | Admitting: Gastroenterology

## 2024-01-09 ENCOUNTER — Other Ambulatory Visit: Payer: Self-pay | Admitting: Family Medicine

## 2024-01-16 ENCOUNTER — Other Ambulatory Visit: Payer: Self-pay | Admitting: Medical Genetics

## 2024-01-16 DIAGNOSIS — Z006 Encounter for examination for normal comparison and control in clinical research program: Secondary | ICD-10-CM

## 2024-02-01 ENCOUNTER — Encounter: Payer: Self-pay | Admitting: Family Medicine

## 2024-02-02 ENCOUNTER — Ambulatory Visit: Admitting: *Deleted

## 2024-02-02 VITALS — Ht 68.5 in | Wt 250.0 lb

## 2024-02-02 DIAGNOSIS — Z1211 Encounter for screening for malignant neoplasm of colon: Secondary | ICD-10-CM

## 2024-02-02 MED ORDER — NA SULFATE-K SULFATE-MG SULF 17.5-3.13-1.6 GM/177ML PO SOLN: 1.0000 | Freq: Once | ORAL | 0 refills | Status: AC

## 2024-02-02 NOTE — Progress Notes (Signed)
 Pt's name and DOB verified at the beginning of the pre-visit with 2 identifiers  Pt denies any difficulty with ambulating,sitting, laying down or rolling side to side  Pt has no issues moving head neck or swallowing  No egg or soy allergy known to patient   No issues known to pt with past sedation  No FH of Malignant Hyperthermia  Pt is not on home 02   Pt is not on blood thinners   Pt denies issues with constipation    Pt is not on dialysis  Pt denise any abnormal heart rhythms   Pt denies any upcoming cardiac testing  Patient's chart reviewed by Norleen Schillings CNRA prior to pre-visit and patient appropriate for the [facility].  Pre-visit completed and red dot placed by patient's name on their procedure day (on provider's schedule).    Visit in person  Pt states weight is 250 lb  Pt given  both [facility] main # and MD on call # prior to instructions.  Informed pt to come in at the time discussed and is shown on PV instructions.  Pt instructed to use Single[employer].com or GoodRx for a price reduction on prep  Instructed pt where to find PV instructions in My Ch. Copy of instructions  to be sent in mail and address read back to pt to verify correct on envelope. Instructed pt on all aspects of written instructions including med holds clothing to wear and foods to eat and not eat as well as after procedure legal restrictions and to call MD on call if needed.. Pt states understanding. Instructed pt to review instructions again prior to procedure and call main # given if has any questions or any issues. Pt states they will.

## 2024-02-03 ENCOUNTER — Encounter: Payer: Self-pay | Admitting: Gastroenterology

## 2024-02-16 ENCOUNTER — Encounter: Payer: Self-pay | Admitting: Gastroenterology

## 2024-02-16 ENCOUNTER — Ambulatory Visit: Admitting: Gastroenterology

## 2024-02-16 VITALS — BP 101/63 | HR 61 | Temp 97.3°F | Resp 12 | Ht 68.0 in | Wt 250.0 lb

## 2024-02-16 DIAGNOSIS — Z1211 Encounter for screening for malignant neoplasm of colon: Secondary | ICD-10-CM | POA: Diagnosis present

## 2024-02-16 DIAGNOSIS — D128 Benign neoplasm of rectum: Secondary | ICD-10-CM | POA: Diagnosis not present

## 2024-02-16 DIAGNOSIS — K5730 Diverticulosis of large intestine without perforation or abscess without bleeding: Secondary | ICD-10-CM

## 2024-02-16 DIAGNOSIS — K640 First degree hemorrhoids: Secondary | ICD-10-CM

## 2024-02-16 MED ORDER — SODIUM CHLORIDE 0.9 % IV SOLN: 500.0000 mL | Freq: Once | INTRAVENOUS | Status: DC

## 2024-02-16 NOTE — Op Note (Signed)
 [facility] Patient Name: Marc Fisher Procedure Date: 02/16/2024 7:08 AM MRN: [id number] Endoscopist: Lynnie Bring , MD, [id number] Age: 48 Referring MD:  Date of Birth: 04/21/1975 Gender: Male Account #: [id number] Procedure:                Colonoscopy Indications:              Screening for colorectal malignant neoplasm Medicines:                Monitored Anesthesia Care Procedure:                Pre-Anesthesia Assessment:                           - Prior to the procedure, a History and Physical                            was performed, and patient medications and                            allergies were reviewed. The patient's tolerance of                            previous anesthesia was also reviewed. The risks                            and benefits of the procedure and the sedation                            options and risks were discussed with the patient.                            All questions were answered, and informed consent                            was obtained. Prior Anticoagulants: The patient has                            taken no anticoagulant or antiplatelet agents. ASA                            Grade Assessment: II - A patient with mild systemic                            disease. After reviewing the risks and benefits,                            the patient was deemed in satisfactory condition to                            undergo the procedure.                           After obtaining informed consent, the colonoscope  was passed under direct vision. Throughout the                            procedure, the patient's blood pressure, pulse, and                            oxygen saturations were monitored continuously. The                            Olympus Scope SN [id number] was introduced through the                            anus and advanced to the 2 cm into the ileum. The                            colonoscopy  was performed without difficulty. The                            patient tolerated the procedure well. The quality                            of the bowel preparation was good. The terminal                            ileum, ileocecal valve, appendiceal orifice, and                            rectum were photographed. Scope In: 8:10:56 AM Scope Out: 8:24:44 AM Scope Withdrawal Time: 0 hours 11 minutes 19 seconds  Total Procedure Duration: 0 hours 13 minutes 48 seconds  Findings:                 A 6 mm polyp was found in the mid rectum. The polyp                            was sessile. The polyp was removed with a cold                            snare. Resection and retrieval were complete.                           Multiple medium-mouthed diverticula were found in                            the sigmoid colon and few in ascending colon.                           Non-bleeding internal hemorrhoids were found during                            retroflexion. The hemorrhoids were small and Grade                            I (  internal hemorrhoids that do not prolapse).                           The terminal ileum appeared normal.                           Retroflexion in the right colon was performed.                           The exam was otherwise without abnormality on                            direct and retroflexion views. Complications:            No immediate complications. Estimated Blood Loss:     Estimated blood loss: none. Impression:               - One 6 mm polyp in the mid rectum, removed with a                            cold snare. Resected and retrieved.                           - Pancolonic diverticulosis predominantly in the                            sigmoid colon.                           - Non-bleeding internal hemorrhoids.                           - The examined portion of the ileum was normal.                           - The examination was otherwise normal on direct                             and retroflexion views. Recommendation:           - Patient has a contact number available for                            emergencies. The signs and symptoms of potential                            delayed complications were discussed with the                            patient. Return to normal activities tomorrow.                            Written discharge instructions were provided to the                            patient.                           -  High fiber diet.                           - Continue present medications.                           - Await pathology results.                           - Repeat colonoscopy for surveillance based on                            pathology results.                           - The findings and recommendations were discussed                            with the patient's family. Lynnie Bring, MD 02/16/2024 8:29:52 AM This report has been signed electronically.

## 2024-02-16 NOTE — Patient Instructions (Addendum)
    Handouts on polyps,diverticulosis,& hemorrhoids given to you today.   Await pathology results on polyps removed   Continue previous diet & medication  Follow high fiber diet     YOU HAD AN ENDOSCOPIC PROCEDURE TODAY AT THE [facility]:   Refer to the procedure report that was given to you for any specific questions about what was found during the examination.  If the procedure report does not answer your questions, please call your gastroenterologist to clarify.  If you requested that your care partner not be given the details of your procedure findings, then the procedure report has been included in a sealed envelope for you to review at your convenience later.  YOU SHOULD EXPECT: Some feelings of bloating in the abdomen. Passage of more gas than usual.  Walking can help get rid of the air that was put into your GI tract during the procedure and reduce the bloating. If you had a lower endoscopy (such as a colonoscopy or flexible sigmoidoscopy) you may notice spotting of blood in your stool or on the toilet paper. If you underwent a bowel prep for your procedure, you may not have a normal bowel movement for a few days.  Please Note:  You might notice some irritation and congestion in your nose or some drainage.  This is from the oxygen used during your procedure.  There is no need for concern and it should clear up in a day or so.  SYMPTOMS TO REPORT IMMEDIATELY:  Following lower endoscopy (colonoscopy or flexible sigmoidoscopy):  Excessive amounts of blood in the stool  Significant tenderness or worsening of abdominal pains  Swelling of the abdomen that is new, acute  Fever of 100F or higher   For urgent or emergent issues, a gastroenterologist can be reached at any hour by calling [phone]. Do not use MyChart messaging for urgent concerns.    DIET:  We do recommend a small meal at first, but then you may proceed to your regular diet.  Drink plenty of fluids  but you should avoid alcoholic beverages for 24 hours.  ACTIVITY:  You should plan to take it easy for the rest of today and you should NOT DRIVE or use heavy machinery until tomorrow (because of the sedation medicines used during the test).    FOLLOW UP: Our staff will call the number listed on your records the next business day following your procedure.  We will call around 7:15- 8:00 am to check on you and address any questions or concerns that you may have regarding the information given to you following your procedure. If we do not reach you, we will leave a message.     If any biopsies were taken you will be contacted by phone or by letter within the next 1-3 weeks.  Please call us  at [phone] if you have not heard about the biopsies in 3 weeks.    SIGNATURES/CONFIDENTIALITY: You and/or your care partner have signed paperwork which will be entered into your electronic medical record.  These signatures attest to the fact that that the information above on your After Visit Summary has been reviewed and is understood.  Full responsibility of the confidentiality of this discharge information lies with you and/or your care-partner.

## 2024-02-16 NOTE — Progress Notes (Signed)
 Called to room to assist during endoscopic procedure.  Patient ID and intended procedure confirmed with present staff. Received instructions for my participation in the procedure from the performing physician.

## 2024-02-16 NOTE — Progress Notes (Deleted)
 Transferred to PACU via stretcher.  Not responding to stimulation at this time.  VSS upon leaving procedure room.

## 2024-02-16 NOTE — Progress Notes (Signed)
 Transferred to PACU via stretcher.  Not responding to stimulation at this time.  VSS upon leaving procedure room.

## 2024-02-16 NOTE — Progress Notes (Signed)
 [facility] History and Physical   Primary Care Physician:  Lafe Domino, DO   Reason for Procedure:   CRC screening  Plan:    colon   The patient was provided an opportunity to ask questions and all were answered. The patient agreed with the plan.   HPI: Marc Fisher is a 48 y.o. male    Past Medical History:  Diagnosis Date   Anxiety    Arthralgia 12/25/2017   Chronic kidney disease    Kidney stones   Depression    Diabetes mellitus without complication (HCC)    GERD (gastroesophageal reflux disease)    Gout    Low vitamin D  level    Lumbosacral strain 11/15/2022   Pilonidal cyst    Sleep apnea    Thyroid  disease     Past Surgical History:  Procedure Laterality Date   CYST EXCISION     face   INCISION AND DRAINAGE Left 06/13/2021   Procedure: INCISION AND DRAINAGE LEFT LONG FINGER;  Surgeon: Murrell Drivers, MD;  Location: [facility];  Service: Orthopedics;  Laterality: Left;   LITHOTRIPSY  02/20/2021   PILONIDAL CYST EXCISION     WISDOM TOOTH EXTRACTION      Prior to Admission medications   Medication Sig Start Date End Date Taking? Authorizing Provider  allopurinol  (ZYLOPRIM ) 300 MG tablet TAKE 1 TABLET(300 MG) BY MOUTH DAILY 01/09/24   Lafe Domino, DO  cyanocobalamin 100 MCG tablet Take 100 mcg by mouth.    [provider]  famotidine  (PEPCID ) 20 MG tablet TAKE 1 TABLET(20 MG) BY MOUTH DAILY 10/13/23   Lafe Domino, DO  Finasteride -Minoxidil  0.1-7 % SOLN Apply to area of hair loss once daily. Patient not taking: Reported on 02/02/2024 09/05/23   Tharon Lung, MD  gabapentin  (NEURONTIN ) 100 MG capsule Take 1 capsule (100 mg total) by mouth daily as needed (pain). Patient not taking: Reported on 02/02/2024 09/05/23   Tharon Lung, MD  glucosamine-chondroitin 500-400 MG tablet Take 1 tablet by mouth.    [provider]  hydrOXYzine  (ATARAX ) 10 MG tablet Take 1 tablet (10 mg total) by mouth 2 (two) times daily as  needed for anxiety. 07/08/23   Dameron, Marisa, DO  levothyroxine  (SYNTHROID ) 200 MCG tablet Take 1 tablet (200 mcg total) by mouth daily before breakfast. 09/05/23   Tharon Lung, MD  methocarbamol  (ROBAXIN ) 750 MG tablet Take 1 tablet (750 mg total) by mouth every 8 (eight) hours as needed for muscle spasms. 11/14/23   Lafe Domino, DO  Multiple Vitamin (MULTI-VITAMIN) tablet Take 1 tablet by mouth daily.    [provider]  pantoprazole  (PROTONIX ) 20 MG tablet Take 1 tablet (20 mg total) by mouth daily. 06/27/23   Dameron, Marisa, DO  tirzepatide [facility]) 5 MG/0.5ML Pen Inject 5 mg into the skin once a week.    [provider]    Current Outpatient Medications  Medication Sig Dispense Refill   allopurinol  (ZYLOPRIM ) 300 MG tablet TAKE 1 TABLET(300 MG) BY MOUTH DAILY 30 tablet 3   cyanocobalamin 100 MCG tablet Take 100 mcg by mouth.     famotidine  (PEPCID ) 20 MG tablet TAKE 1 TABLET(20 MG) BY MOUTH DAILY 30 tablet 11   Finasteride -Minoxidil  0.1-7 % SOLN Apply to area of hair loss once daily. (Patient not taking: Reported on 02/02/2024) 60 g 0   gabapentin  (NEURONTIN ) 100 MG capsule Take 1 capsule (100 mg total) by mouth daily as needed (pain). (Patient not taking: Reported on 02/02/2024) 30 capsule 0  glucosamine-chondroitin 500-400 MG tablet Take 1 tablet by mouth.     hydrOXYzine  (ATARAX ) 10 MG tablet Take 1 tablet (10 mg total) by mouth 2 (two) times daily as needed for anxiety. 30 tablet 0   levothyroxine  (SYNTHROID ) 200 MCG tablet Take 1 tablet (200 mcg total) by mouth daily before breakfast. 90 tablet 1   methocarbamol  (ROBAXIN ) 750 MG tablet Take 1 tablet (750 mg total) by mouth every 8 (eight) hours as needed for muscle spasms. 30 tablet 0   Multiple Vitamin (MULTI-VITAMIN) tablet Take 1 tablet by mouth daily.     pantoprazole  (PROTONIX ) 20 MG tablet Take 1 tablet (20 mg total) by mouth daily. 90 tablet 3   tirzepatide (MOUNJARO) 5 MG/0.5ML Pen Inject 5 mg into the  skin once a week.     Current Facility-Administered Medications  Medication Dose Route Frequency Provider Last Rate Last Admin   0.9 %  sodium chloride  infusion  500 mL Intravenous Once Charlanne Groom, MD        Allergies as of 02/16/2024 - Review Complete 02/16/2024  Allergen Reaction Noted   Penicillins  11/09/2015   Sulfa antibiotics  11/09/2015    Family History  Problem Relation Age of Onset   Diabetes Mother    Diabetes Maternal Grandfather    Hyperlipidemia Paternal Grandfather    Colon cancer Neg Hx    Colon polyps Neg Hx    Esophageal cancer Neg Hx    Stomach cancer Neg Hx    Rectal cancer Neg Hx     Social History   Socioeconomic History   Marital status: Married    Spouse name: Not on file   Number of children: Not on file   Years of education: Not on file   Highest education level: Master's degree (e.g., MA, MS, MEng, MEd, MSW, MBA)  Occupational History   Not on file  Tobacco Use   Smoking status: Former    Current packs/day: 0.00    Types: Cigarettes    Quit date: 2020    Years since quitting: 5.9   Smokeless tobacco: Never  Vaping Use   Vaping status: Never Used  Substance and Sexual Activity   Alcohol use: Yes    Comment: 2 per week   Drug use: No   Sexual activity: Not on file  Other Topics Concern   Not on file  Social History Narrative   Left Handed    Lives in a two story home    Social Drivers of Health   Financial Resource Strain: Low Risk  (09/01/2023)   Overall Financial Resource Strain (CARDIA)    Difficulty of Paying Living Expenses: Not very hard  Food Insecurity: No Food Insecurity (09/01/2023)   Hunger Vital Sign    Worried About Running Out of Food in the Last Year: Never true    Ran Out of Food in the Last Year: Never true  Transportation Needs: No Transportation Needs (09/01/2023)   PRAPARE - Administrator, Civil Service (Medical): No    Lack of Transportation (Non-Medical): No  Physical Activity:  Insufficiently Active (09/01/2023)   Exercise Vital Sign    Days of Exercise per Week: 2 days    Minutes of Exercise per Session: 30 min  Stress: No Stress Concern Present (09/01/2023)   [employer] of Occupational Health - Occupational Stress Questionnaire    Feeling of Stress: Not at all  Social Connections: Moderately Integrated (09/01/2023)   Social Connection and Isolation Panel    Frequency  of Communication with Friends and Family: More than three times a week    Frequency of Social Gatherings with Friends and Family: Twice a week    Attends Religious Services: Never    Database Administrator or Organizations: Yes    Attends Engineer, Structural: More than 4 times per year    Marital Status: Married  Catering Manager Violence: Unknown (06/18/2021)   Received from [facility]   HITS    Physically Hurt: Not on file    Insult or Talk Down To: Not on file    Threaten Physical Harm: Not on file    Scream or Curse: Not on file    Review of Systems: Positive for none All other review of systems negative except as mentioned in the HPI.  Physical Exam: Vital signs in last 24 hours: @VSRANGES @   General:   Alert,  Well-developed, well-nourished, pleasant and cooperative in NAD Lungs:  Clear throughout to auscultation.   Heart:  Regular rate and rhythm; no murmurs, clicks, rubs,  or gallops. Abdomen:  Soft, nontender and nondistended. Normal bowel sounds.   Neuro/Psych:  Alert and cooperative. Normal mood and affect. A and O x 3    No significant changes were identified.  The patient continues to be an appropriate candidate for the planned procedure and anesthesia.   Anselm Bring, MD. [facility] 02/16/2024 8:07 AM@

## 2024-02-17 ENCOUNTER — Telehealth: Payer: Self-pay | Admitting: *Deleted

## 2024-02-17 NOTE — Telephone Encounter (Signed)
  Follow up Call-     02/16/2024    7:19 AM  Call back number  Post procedure Call Back phone  # [phone]  Permission to leave phone message Yes     Patient questions:  Do you have a fever, pain , or abdominal swelling? No. Pain Score  0 *  Have you tolerated food without any problems? Yes.    Have you been able to return to your normal activities? Yes.    Do you have any questions about your discharge instructions: Diet   No. Medications  No. Follow up visit  No.  Do you have questions or concerns about your Care? No.  Actions: * If pain score is 4 or above: No action needed, pain <4.

## 2024-02-18 LAB — SURGICAL PATHOLOGY

## 2024-02-21 ENCOUNTER — Ambulatory Visit: Payer: Self-pay | Admitting: Gastroenterology

## 2024-03-28 ENCOUNTER — Other Ambulatory Visit: Payer: Self-pay | Admitting: Family Medicine

## 2024-04-03 ENCOUNTER — Encounter: Payer: Self-pay | Admitting: Family Medicine

## 2024-04-05 LAB — GENECONNECT MOLECULAR SCREEN: Genetic Analysis Overall Interpretation: NEGATIVE

## 2024-04-05 MED ORDER — PERMETHRIN 5 % EX CREA: 1.0000 | TOPICAL_CREAM | Freq: Once | CUTANEOUS | 0 refills | Status: AC

## 2024-04-07 ENCOUNTER — Encounter: Payer: Self-pay | Admitting: Family Medicine

## 2024-04-07 DIAGNOSIS — D329 Benign neoplasm of meninges, unspecified: Secondary | ICD-10-CM | POA: Insufficient documentation

## 2024-04-14 ENCOUNTER — Other Ambulatory Visit: Payer: Self-pay | Admitting: Family Medicine
